# Patient Record
Sex: Male | Born: 1970
Health system: Southern US, Community
[De-identification: ages and names within clinical notes are randomized; demographics above are authoritative.]

## PROBLEM LIST (undated history)

## (undated) DIAGNOSIS — E669 Obesity, unspecified: Secondary | ICD-10-CM

## (undated) DIAGNOSIS — M1990 Unspecified osteoarthritis, unspecified site: Secondary | ICD-10-CM

## (undated) DIAGNOSIS — M545 Low back pain, unspecified: Secondary | ICD-10-CM

## (undated) DIAGNOSIS — J40 Bronchitis, not specified as acute or chronic: Secondary | ICD-10-CM

## (undated) DIAGNOSIS — K219 Gastro-esophageal reflux disease without esophagitis: Secondary | ICD-10-CM

## (undated) DIAGNOSIS — F319 Bipolar disorder, unspecified: Secondary | ICD-10-CM

## (undated) DIAGNOSIS — E785 Hyperlipidemia, unspecified: Secondary | ICD-10-CM

## (undated) HISTORY — DX: Low back pain, unspecified: M54.50

## (undated) HISTORY — DX: Obesity, unspecified: E66.9

## (undated) HISTORY — DX: Unspecified osteoarthritis, unspecified site: M19.90

## (undated) HISTORY — DX: Low back pain: M54.5

## (undated) HISTORY — DX: Hyperlipidemia, unspecified: E78.5

## (undated) HISTORY — DX: Bronchitis, not specified as acute or chronic: J40

## (undated) HISTORY — DX: Gastro-esophageal reflux disease without esophagitis: K21.9

---

## 1999-02-08 ENCOUNTER — Ambulatory Visit (HOSPITAL_COMMUNITY): Admission: RE | Admit: 1999-02-08 | Discharge: 1999-02-08 | Payer: Self-pay | Admitting: *Deleted

## 1999-07-09 ENCOUNTER — Ambulatory Visit (HOSPITAL_COMMUNITY): Admission: RE | Admit: 1999-07-09 | Discharge: 1999-07-09 | Payer: Self-pay | Admitting: *Deleted

## 1999-11-15 ENCOUNTER — Inpatient Hospital Stay (HOSPITAL_COMMUNITY): Admission: AD | Admit: 1999-11-15 | Discharge: 1999-11-21 | Payer: Self-pay | Admitting: Psychiatry

## 1999-11-23 ENCOUNTER — Emergency Department (HOSPITAL_COMMUNITY): Admission: EM | Admit: 1999-11-23 | Discharge: 1999-11-23 | Payer: Self-pay | Admitting: Emergency Medicine

## 2002-08-02 ENCOUNTER — Emergency Department (HOSPITAL_COMMUNITY): Admission: EM | Admit: 2002-08-02 | Discharge: 2002-08-02 | Payer: Self-pay | Admitting: Emergency Medicine

## 2006-10-21 ENCOUNTER — Ambulatory Visit (HOSPITAL_COMMUNITY): Admission: RE | Admit: 2006-10-21 | Discharge: 2006-10-21 | Payer: Self-pay | Admitting: Family Medicine

## 2010-09-22 NOTE — H&P (Signed)
[facility]  Patient:    RAMOND, DARNELL                      MRN: [id number] Adm. Date:  [id number] Attending:  Marlyn Corporal Fabmy Dictator:   Johnella Moloney, N.P.                         History and Physical  IDENTIFYING INFORMATION:  Mr. Fitzgibbon is a 40 year old white single male admitted on an involuntary basis, July 11 for psychotic symptoms including thinking that he was the Messiah and his father actually initiated the involuntary commitment.  Apparently, according to father, he has had violent outbursts over the past few weeks, becoming delusional, angry, drawing pictures of [employer] and [employer] and up all night working on a plan to save the people from [employer] conspiracy occurring on [employer].  HISTORY OF PRESENT ILLNESS:  Patient states, "I beat the stock market.  I took a test for 10 hours."  He stated, "They threw the disk away."  Patient has a long history of mental illness.  He is crying intermittently.  He continues to be delusional.  He admits to drawing pictures of [address] and [place].  He states he has been sleeping 5-6 hours a night.  Appetite according to him has been good; however, at times he says it has not been good.  He is hyperactive, hyperverbal.  He states there is a government conspiracy on [employer] and it is called "1.0."  He is continually asking for his _________ .  He says he cannot focus or think, admits that he is totally depressed, but denies suicidal ideation and he says, "If I were dead, I would go to [facility] and be better off than here on earth."  PAST PSYCHIATRIC HISTORY:  Patient has seen Dr. Jodi Marble for the past 10 years at [facility].  He has no inpatient psychiatric hospitalizations.  PAST MEDICAL HISTORY:  Patient has no primary care doctor.  He denies medical problems.  MEDICATIONS: 1. _________ 20 mg.  He states he took 2-1/2 tablets every morning and then    he says he took one  in the morning, one in the afternoon, and 1-1/2 two    hours before bedtime.  I am not sure how he has been taking the _________. 2. He states he is on Elavil 50 mg at bedtime.  He has been on this since    1994; however, he states he has not taken this in two weeks.  DRUG ALLERGIES:  No known drug allergies.  SOCIAL HISTORY:  Patient apparently lives with his parents.  He is single.  He has one brother and one sister, completed the 12th grade.  He went to [place] for a year and a half.  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  Then he says he has been back 4-5 months from [place].  FAMILY HISTORY:  None.  ALCOHOL/DRUG HISTORY:  Patient smokes marijuana.  He states he takes two drags every other night and has been doing this for seven years.  He last used two days ago.  He denies alcohol abuse.  POSITIVE PHYSICAL EXAMINATION FINDINGS:  His physical exam is pending.  He is too agitated to do a physical exam.  VITAL SIGNS:  Temperature 97.3, pulse 99, respirations 20, blood pressure 124/83, weight 138 pounds, height is 5 feet  9 inches.  CURRENT MENTAL STATUS EXAMINATION:  A somewhat disheveled thin white male who is pacing, getting up and down out of bed.  At one point his roommate walked in.  He took off his shorts and said, "These are yours, arent they?"  Then he put his shorts back on.  He is trying to be cooperative, but due to his agitation, he is having a difficult time.  Speech is pressured, not always relevant.  Mood:  Quiet at times, agitated.  Mood is labile.  He denies current suicidal ideation or homicidal ideation.  Thought process.  Patient is delusional.  He denies hallucinations.  He does have some loose associations. Cognitive:  He is alert and oriented.  He is exhibiting poor judgment, poor impulse control, and poor insight at this point.  CURRENT DIAGNOSES: Axis I:    1. Bipolar disorder, manic.            2.  Attention deficit disorder. Axis II:   Deferred. Axis III:  None. Axis IV:   Severe, related to problems with primary support group in dealing            with his mental illness. Axis V:    Current global assessment of functioning 30, highest in the past            year was 70.  CURRENT TREATMENT PLAN AND RECOMMENDATIONS:  Involuntary commitment to [facility] unit.  Our goal will be to maintain his safety, check every 15 minutes, and also to treat his manic behavior.  We will start off with Eskalith CR 450 mg q.12h. p.o., Depakote ER 500 mg q.h.s. p.o., Cogentin 0.5 mg q.6h. p.r.n. for ETS, Haldol 5 mg p.o. IM q.4h. p.r.n. for agitation, may give two hours later, 1 mg Haldol if the patient is still agitated. Patient may refuse medications if he is not agitated.  Tentative length of stay and discharge plan 3-5 days. DD:  11/16/99 TD:  11/16/99 Job: [id number]

## 2010-09-22 NOTE — Discharge Summary (Signed)
[facility]  Patient:    Eduardo Kelly, Eduardo Kelly                      MRN: [id number] Adm. Date:  [id number] Disc. Date: [id number] Attending:  Cathren Laine                           Discharge Summary  ADMISSION DIAGNOSES: Axis I:    1. Bipolar disorder, manic.            2. Attention-deficit disorder. Axis II:   Deferred. Axis III:  No diagnosis. Axis IV:   Severe (related to problems with primary support group). Axis V:    Global assessment of functioning 30 on admission; highest in past            year being 70.  DISCHARGE DIAGNOSES: Axis I:    1. Bipolar disorder, manic.            2. Attention-deficit disorder. Axis II:   Deferred. Axis III:  No diagnosis. Axis IV:   Severe (related to primary support group). Axis V:    Global assessment of functioning 30 on admission; 60-70 on            discharge.  BRIEF HISTORY:  The patient is a 40 year old, single, Caucasian male whose commitment was initiated by his family.  Patient had become manic and was thinking that he was the messiah.  He was talking about the stock market in a disjointed way.  Patient has previously been followed at the office with diagnosis of attention-deficit disorder with suspicion that he has had manic breakthroughs.  He had been stabilized on Adderall with benefit.  Patient, himself, seemed to have little insight and his manic delusions were very vivid.  PERTINENT PHYSICAL AND LABORATORY DATA:  Physical examination, on admission, was unremarkable.  Admission lab work included a normal CBC, normal routine chemistry profile and normal values on his thyroid profile.  Urine drug screen was positive for amphetamines and marijuana, given that the patient had prescriptions for Adderall via my office.  Urinalysis was essentially unremarkable.  Nitrite and esterase are negative.  COURSE IN HOSPITAL:  The patient was initially given Haldol 5 mg IM q.4h. p.r.n. and Ativan 1 mg p.o. or IM  for agitation.  It was established Eskalith CR 450 mg q.12h. and Depakote ER 500 mg q.h.s. and allowed Cogentin 0.5 mg q.6h. p.r.n. for EPS.  Depakote levels and lithium levels were ordered but are not on the chart at the time of this dictation.  Received Motrin 400 mg q.4h. p.r.n. for pain.  Although he had not had a clear urinary tract infection, given that his urine was turbid, I felt it prudent to put him on Septra DS for 10 days.  Initially confused, coming out of his room naked and walking down the hall. As his hospitalization progressed and his medication kicked in, the patient calmed down considerably.  By the time of his discharge, patient was sleeping through the night, was showing improved ___________ connectiveness.  CONDITION ON DISCHARGE:  Patient is doing well and he has stabilized.  He is calmer and in better touch with reality.  He was tolerating his medication well and without evidence of sedation.  His lithium level was within the therapeutic range.  Depakote level was subtherapeutic but, given his fair clinical response, he was maintained on his dosage.  Patient categorically denies suicidal or homicidal ideas  and is not psychotic.  MEDICATION AND FOLLOW-UP:  Patient was discharged with prescriptions for Septra DS b.i.d. for nine days, Depakote ER 500 mg q.h.s. and Eskalith CR 450 mg b.i.d.  He was to follow up with office in four weeks. DD:  01/05/00 TD:  01/08/00 Job: [id number]

## 2010-09-22 NOTE — H&P (Signed)
[facility]  Patient:    Eduardo Kelly, Eduardo Kelly                      MRN: [id number] Adm. Date:  [id number] Attending:  Marlyn Corporal Fabmy Dictator:   Eduard Roux, NP                         History and Physical  ALLERGIES:  No known allergies.  REVIEW OF SYSTEMS:   HEAD:  Patient reports having a severe headache approximately six weeks ago where "a freight train ran through his head" and the muscles contracted in his face.  He denies history of head trauma and loss of consciousness.  EYES:  Denies visual changes.  Patient does not wear corrective lenses.  EARS:  Denies hearing loss, infections, earaches, or tinnitus; however, patient reports putting a pencil in both ears.  NOSE:  No upper respiratory tract symptoms.  No epistaxis.  THROAT:  Denies hoarseness, pain or lesions.  DENTITION:  Denies any gum infections or toothache.  Patient does bruxate.  SKIN:  Denies any abnormal bruises, rashes or swelling. CARDIOVASCULAR:  Denies chest pain or previous MI.  Reports some intermittent palpitations.  No history of a murmur or hypertension.  PULMONARY:  Patient is a smoker; he smokes approximately a pack a day x 7 years.  There is no cough, dyspnea or asthma.  GASTROINTESTINAL:  Denies abdominal pain, peptic ulcer disease, melena.  No nausea, vomiting or diarrhea.  GENITOURINARY:  Denies dysuria, urgency, frequency or any discharge.  MUSCULOSKELETAL:  Reports chronic low back strain.  States he has fractured his left ulna.  NERVOUS SYSTEM:  Questions whether he had a seizure the night that he had that severe headache, with no documented history of epilepsy.  HEMATOPOIETIC:  Denies anemia and has never received a blood transfusion.  ENDOCRINE:  No known history of thyroid ______ or diabetes.  PHYSICAL EXAMINATION:  VITAL SIGNS:  Temperature 98.1.  Pulse 81.  Respirations are 18.  He is 5 feet 9 inches.  He weighs 138 pounds.  His blood pressure was  100/64.  GENERAL APPEARANCE:  He is a thin white male.  He is very alert and cooperative and very pleasant.  SKIN:  There is a birth mark to his sacrum.  There are bites to bilateral ankles and it appears as though they are mosquito bites and some acne to his back; otherwise, normal dermatological exam.  HEENT:  Head was normocephalic.  Eyes:  Extraocular movements intact.  Pupils are equal, round and reactive to light.  Funduscopic exam was benign.  Ears: Right external ear canal was erythematous.  Tympanic membranes exhibited normal landmarks.  Nose:  There was no septal deviation.  Nares were very small.  Throat:  Buccal mucosa was clear.  There were no lesions.  Tongue was in the midline without vesiculations.  Dentition is in good repair.  NECK:  Supple with trachea midline.  THORAX:  Bilateral symmetrical expansion.  LUNGS:  Clear to auscultation.  HEART:  S1 and S2.  No clicks, rubs or murmurs could be appreciated.  VASCULAR:  Pulses were equal.  No bruits were auscultated, carotid or aortic. No dependent edema.  ABDOMEN:  Soft.  No masses with palpation.  Hyperactive bowel sounds x 4.  No tenderness.  No organomegaly could be appreciated.  GLANDULAR:  There was no adenopathy, supraclavicular or cervical.  Palpable thyroid without nodularity.  BONES AND JOINTS:  No swelling, tenderness or deformity noted.  MUSCULOSKELETAL:  There is no atrophy.  Strength was equal bilaterally, 5/5. Negative straight leg raise.  EXTREMITIES:  Full range of motion.  There was no cyanosis or clubbing. Slight bilateral knee crepitus.  NEUROLOGIC:  Cranial nerves II-XII were grossly intact.  Motor function is intact, 5/5.  Cerebellar function is intact.  Patient could perform finger-to-nose, heel-to-shin and rapid alternating movements.  Romberg was negative.  Deep tendon reflexes were 2.  IMPRESSION:  Normal physical exam.  No acute findings. DD:  11/17/99 TD:  11/17/99 Job:  [id number]

## 2010-11-14 ENCOUNTER — Encounter: Payer: Self-pay | Admitting: Family Medicine

## 2010-11-14 DIAGNOSIS — J40 Bronchitis, not specified as acute or chronic: Secondary | ICD-10-CM | POA: Insufficient documentation

## 2010-11-14 DIAGNOSIS — M545 Low back pain: Secondary | ICD-10-CM | POA: Insufficient documentation

## 2010-11-14 DIAGNOSIS — E669 Obesity, unspecified: Secondary | ICD-10-CM | POA: Insufficient documentation

## 2011-08-23 DIAGNOSIS — S5010XA Contusion of unspecified forearm, initial encounter: Secondary | ICD-10-CM | POA: Diagnosis not present

## 2011-08-23 DIAGNOSIS — IMO0002 Reserved for concepts with insufficient information to code with codable children: Secondary | ICD-10-CM | POA: Diagnosis not present

## 2012-09-02 ENCOUNTER — Ambulatory Visit (INDEPENDENT_AMBULATORY_CARE_PROVIDER_SITE_OTHER): Payer: Medicaid Other | Admitting: Family Medicine

## 2012-09-02 ENCOUNTER — Other Ambulatory Visit: Payer: Self-pay | Admitting: Family Medicine

## 2012-09-02 ENCOUNTER — Encounter: Payer: Self-pay | Admitting: Family Medicine

## 2012-09-02 VITALS — BP 140/98 | HR 100 | Temp 98.9°F | Resp 16 | Wt 238.0 lb

## 2012-09-02 DIAGNOSIS — R109 Unspecified abdominal pain: Secondary | ICD-10-CM | POA: Diagnosis not present

## 2012-09-02 DIAGNOSIS — M5137 Other intervertebral disc degeneration, lumbosacral region: Secondary | ICD-10-CM

## 2012-09-02 DIAGNOSIS — R635 Abnormal weight gain: Secondary | ICD-10-CM

## 2012-09-02 DIAGNOSIS — M1990 Unspecified osteoarthritis, unspecified site: Secondary | ICD-10-CM | POA: Insufficient documentation

## 2012-09-02 DIAGNOSIS — M5136 Other intervertebral disc degeneration, lumbar region: Secondary | ICD-10-CM

## 2012-09-02 DIAGNOSIS — E782 Mixed hyperlipidemia: Secondary | ICD-10-CM | POA: Diagnosis not present

## 2012-09-02 LAB — CBC WITH DIFFERENTIAL/PLATELET
Basophils Absolute: 0 10*3/uL (ref 0.0–0.1)
Basophils Relative: 0 % (ref 0–1)
Eosinophils Absolute: 0.1 10*3/uL (ref 0.0–0.7)
Eosinophils Relative: 1 % (ref 0–5)
HCT: 45.8 % (ref 39.0–52.0)
Hemoglobin: 16.1 g/dL (ref 13.0–17.0)
Lymphocytes Relative: 27 % (ref 12–46)
Lymphs Abs: 3.1 10*3/uL (ref 0.7–4.0)
MCH: 29.5 pg (ref 26.0–34.0)
MCHC: 35.2 g/dL (ref 30.0–36.0)
MCV: 84 fL (ref 78.0–100.0)
Monocytes Absolute: 0.9 10*3/uL (ref 0.1–1.0)
Monocytes Relative: 8 % (ref 3–12)
Neutro Abs: 7.3 10*3/uL (ref 1.7–7.7)
Neutrophils Relative %: 64 % (ref 43–77)
Platelets: 331 10*3/uL (ref 150–400)
RBC: 5.45 MIL/uL (ref 4.22–5.81)
RDW: 13.4 % (ref 11.5–15.5)
WBC: 11.5 10*3/uL — ABNORMAL HIGH (ref 4.0–10.5)

## 2012-09-02 MED ORDER — PANTOPRAZOLE SODIUM 40 MG PO TBEC: 40.0000 mg | DELAYED_RELEASE_TABLET | Freq: Two times a day (BID) | ORAL | Status: DC

## 2012-09-02 NOTE — Progress Notes (Signed)
Subjective:    Patient ID: Eduardo Kelly, male    DOB: 1970-10-31, 42 y.o.   MRN: [id number]  HPI  Patient presents with 7-8 months of epigastric to right upper quadrant abdominal pain. He states the pain is constant. It is a gnawing pressure-like sensation. He has no exacerbating factors. He states that when he needs the pain is better somewhat for a short period of time. He has had one episode of melanotic stool. He denies any hematochezia. He denies any fever. He also reports weight gain and fatigue. Due to the weight gain he has recently resumed smoking. He states his blood pressures are then controlled in the past but it is elevated today. He denies any chest pain or shortness of breath.  He is been using Prilosec for the last 8 days due to severe reflux. He is also been consuming excessive TUMS.  He has a history of degenerative disease in the L-spine per his report. He is currently controlled using a TENS unit for muscle relaxation. He requires no pain killers or NSAIDs for his back. Hewould like a  prescription for a TENS unit.  Past Medical History  Diagnosis Date  . Mild obesity   . Bronchitis   . Low back pain   . Arthritis     ddd l-spine   No current outpatient prescriptions on file prior to visit.   No current facility-administered medications on file prior to visit.   No Known Allergies History   Social History  . Marital Status: Single    Spouse Name: N/A    Number of Children: N/A  . Years of Education: N/A   Occupational History  . Not on file.   Social History Main Topics  . Smoking status: Current Some Day Smoker  . Smokeless tobacco: Not on file  . Alcohol Use: Not on file  . Drug Use: Not on file  . Sexually Active: Not on file   Other Topics Concern  . Not on file   Social History Narrative  . No narrative on file     Review of Systems Remainder of review of systems is negative    Objective:   Physical Exam  Constitutional: He appears  well-developed and well-nourished.  Cardiovascular: Normal rate, regular rhythm, normal heart sounds and intact distal pulses.  Exam reveals no gallop and no friction rub.   No murmur heard. Pulmonary/Chest: Effort normal and breath sounds normal. No respiratory distress. He has no wheezes. He has no rales. He exhibits no tenderness.  Abdominal: Soft. Bowel sounds are normal. He exhibits no distension. There is tenderness (tender to palpation in RUQ). There is no rebound.  Musculoskeletal: He exhibits tenderness (along L-spine paraspinal muscles.).          Assessment & Plan:  1. Abdominal  pain, other specified site I suspect a duodenal ulcer. Protonix 40 mg by mouth twice a day. Recheck the patient in one month. Sooner if worse. Symptoms are not improved in one month I would get a right upper quadrant ultrasound to rule out biliary tract disease and also consult GI for endoscopy.  We discussed the possibility of gallstones, but the patient states there is no pain upon eating. - Basic Metabolic Panel - CBC with Differential - Lipase - H. pylori Screen - pantoprazole (PROTONIX) 40 MG tablet; Take 1 tablet (40 mg total) by mouth 2 (two) times daily.  Dispense: 60 tablet; Refill: 3  2. Weight gain  - TSH - Testosterone  3.  DDD (degenerative disc disease), lumbar I refilled prescription for TENS unit for the patient.

## 2012-09-03 LAB — TESTOSTERONE: Testosterone: 386 ng/dL (ref 300–890)

## 2012-09-03 LAB — BASIC METABOLIC PANEL
BUN: 16 mg/dL (ref 6–23)
CO2: 23 mEq/L (ref 19–32)
Calcium: 9.8 mg/dL (ref 8.4–10.5)
Chloride: 102 mEq/L (ref 96–112)
Creat: 1.17 mg/dL (ref 0.50–1.35)
Glucose, Bld: 95 mg/dL (ref 70–99)
Potassium: 4.1 mEq/L (ref 3.5–5.3)
Sodium: 137 mEq/L (ref 135–145)

## 2012-09-03 LAB — LIPASE: Lipase: 23 U/L (ref 0–75)

## 2012-09-03 LAB — T4, FREE: Free T4: 1.25 ng/dL (ref 0.80–1.80)

## 2012-09-03 LAB — TSH: TSH: 5.407 u[IU]/mL — ABNORMAL HIGH (ref 0.350–4.500)

## 2012-09-04 ENCOUNTER — Telehealth: Payer: Self-pay | Admitting: Family Medicine

## 2012-09-04 LAB — LIPID PANEL
Cholesterol: 218 mg/dL — ABNORMAL HIGH (ref 0–200)
VLDL: 61 mg/dL — ABNORMAL HIGH (ref 0–40)

## 2012-09-04 NOTE — Telephone Encounter (Signed)
done

## 2012-09-11 LAB — HELICOBACTER PYLORI  ANTIBODY, IGM: Helicobacter pylori, IgM: 4.3 U/mL (ref <9.0)

## 2012-09-22 NOTE — Addendum Note (Signed)
Addended by: Durwin Nora B on: 09/22/2012 10:39 AM   Modules accepted: Orders

## 2012-11-27 ENCOUNTER — Encounter: Payer: Self-pay | Admitting: Family Medicine

## 2012-11-27 ENCOUNTER — Ambulatory Visit (INDEPENDENT_AMBULATORY_CARE_PROVIDER_SITE_OTHER): Payer: Medicare Other | Admitting: Family Medicine

## 2012-11-27 VITALS — BP 126/84 | HR 98 | Temp 98.7°F | Resp 18 | Wt 241.0 lb

## 2012-11-27 DIAGNOSIS — E785 Hyperlipidemia, unspecified: Secondary | ICD-10-CM | POA: Diagnosis not present

## 2012-11-27 DIAGNOSIS — K219 Gastro-esophageal reflux disease without esophagitis: Secondary | ICD-10-CM | POA: Insufficient documentation

## 2012-11-27 NOTE — Progress Notes (Signed)
  Subjective:    Patient ID: Eduardo Kelly, male    DOB: 08/17/1970, 42 y.o.   MRN: [id number]  HPI  Patient is here for followup. Please see his last office visit. His abdominal discomfort stopped immediately upon taking protonix 40 mg by mouth daily.  He denies any further abdominal pain, melena, hematochezia, or fevers. He does get indigestion if he stops taking the PPI.  He is here today to discuss his cholesterol. He was found to have elevated triglycerides, low HDL, elevated LDL at his last ov.  He states he eats too hamburgers every day. He does not exercise at all. He spends most of the day sleeping and eating junk food. He does not exercise due to low back pain.  He does complain of some testicular discomfort after masturbation. He states that the testicles are extremely sensitive to touch afterwards. He denies any dysuria, hematuria or penile discharge. He has not had a sexual partner in 15 years and is not concerned about STDs. Past Medical History  Diagnosis Date  . Mild obesity   . Bronchitis   . Low back pain   . Arthritis     ddd l-spine  . GERD (gastroesophageal reflux disease)   . Dyslipidemia (high LDL; low HDL)    Current Outpatient Prescriptions on File Prior to Visit  Medication Sig Dispense Refill  . pantoprazole (PROTONIX) 40 MG tablet Take 1 tablet (40 mg total) by mouth 2 (two) times daily.  60 tablet  3   No current facility-administered medications on file prior to visit.   No Known Allergies History   Social History  . Marital Status: Single    Spouse Name: N/A    Number of Children: N/A  . Years of Education: N/A   Occupational History  . Not on file.   Social History Main Topics  . Smoking status: Current Some Day Smoker  . Smokeless tobacco: Not on file  . Alcohol Use: No  . Drug Use: Not on file  . Sexually Active: Not on file   Other Topics Concern  . Not on file   Social History Narrative  . No narrative on file     Review of  Systems  All other systems reviewed and are negative.       Objective:   Physical Exam  Constitutional: He appears well-developed and well-nourished.  Cardiovascular: Normal rate, regular rhythm, normal heart sounds and intact distal pulses.   No murmur heard. Pulmonary/Chest: Effort normal and breath sounds normal. No respiratory distress. He has no wheezes. He has no rales.  Abdominal: Soft. Bowel sounds are normal. He exhibits no distension. There is no tenderness. There is no rebound. Hernia confirmed negative in the right inguinal area and confirmed negative in the left inguinal area.  Genitourinary: Penis normal. Right testis shows no mass, no swelling and no tenderness. Left testis shows no mass, no swelling and no tenderness. No penile tenderness.  Lymphadenopathy:       Right: No inguinal adenopathy present.          Assessment & Plan:  1. Dyslipidemia Recommended fish oil 2 g by mouth daily. Recommended a diet low in saturated fats and carbohydrates. Recommended that he increase his intake of fresh fruits and vegetables. Recommended he begin exercising with an ultimate goal of 30 minutes of aerobic exercise 5 days a week.  Recheck in 3 months.

## 2012-12-29 ENCOUNTER — Other Ambulatory Visit: Payer: Self-pay | Admitting: Family Medicine

## 2012-12-29 ENCOUNTER — Telehealth: Payer: Self-pay | Admitting: Family Medicine

## 2012-12-29 MED ORDER — DICLOFENAC SODIUM 1 % TD GEL: 2.0000 g | Freq: Four times a day (QID) | TRANSDERMAL | Status: DC

## 2012-12-29 NOTE — Telephone Encounter (Signed)
..  Patient aware per vm 

## 2012-12-29 NOTE — Telephone Encounter (Signed)
I will e-scribe. 

## 2013-02-19 ENCOUNTER — Ambulatory Visit (INDEPENDENT_AMBULATORY_CARE_PROVIDER_SITE_OTHER): Payer: Medicare Other | Admitting: Family Medicine

## 2013-02-19 ENCOUNTER — Encounter: Payer: Self-pay | Admitting: Family Medicine

## 2013-02-19 VITALS — BP 110/80 | HR 82 | Temp 97.8°F | Resp 18 | Wt 201.0 lb

## 2013-02-19 DIAGNOSIS — Z23 Encounter for immunization: Secondary | ICD-10-CM | POA: Diagnosis not present

## 2013-02-19 DIAGNOSIS — E785 Hyperlipidemia, unspecified: Secondary | ICD-10-CM | POA: Diagnosis not present

## 2013-02-19 NOTE — Progress Notes (Signed)
Subjective:    Patient ID: Eduardo Kelly, male    DOB: [birth year], 42 y.o.   MRN: [id number]  HPI  11/27/12 Patient is here for followup. Please see his last office visit. His abdominal discomfort stopped immediately upon taking protonix 40 mg by mouth daily.  He denies any further abdominal pain, melena, hematochezia, or fevers. He does get indigestion if he stops taking the PPI.  He is here today to discuss his cholesterol. He was found to have elevated triglycerides, low HDL, elevated LDL at his last ov.  He states he eats too hamburgers every day. He does not exercise at all. He spends most of the day sleeping and eating junk food. He does not exercise due to low back pain.  He does complain of some testicular discomfort after masturbation. He states that the testicles are extremely sensitive to touch afterwards. He denies any dysuria, hematuria or penile discharge. He has not had a sexual partner in 15 years and is not concerned about STDs.  At that time, my plan was: 1. Dyslipidemia Recommended fish oil 2 g by mouth daily. Recommended a diet low in saturated fats and carbohydrates. Recommended that he increase his intake of fresh fruits and vegetables. Recommended he begin exercising with an ultimate goal of 30 minutes of aerobic exercise 5 days a week.  Recheck in 3 months.  02/19/13 He is here today for recheck.  He has lost 40 pounds since his last office visit he is exercising every day. He is drastically reduced carbohydrate intake. Past Medical History  Diagnosis Date  . Mild obesity   . Bronchitis   . Low back pain   . Arthritis     ddd l-spine  . GERD (gastroesophageal reflux disease)   . Dyslipidemia (high LDL; low HDL)    Current Outpatient Prescriptions on File Prior to Visit  Medication Sig Dispense Refill  . diclofenac sodium (VOLTAREN) 1 % GEL Apply 2 g topically 4 (four) times daily.  100 g  1  . pantoprazole (PROTONIX) 40 MG tablet Take 1 tablet (40 mg total) by  mouth 2 (two) times daily.  60 tablet  3   No current facility-administered medications on file prior to visit.   No Known Allergies History   Social History  . Marital Status: Single    Spouse Name: N/A    Number of Children: N/A  . Years of Education: N/A   Occupational History  . Not on file.   Social History Main Topics  . Smoking status: Current Some Day Smoker  . Smokeless tobacco: Not on file  . Alcohol Use: No  . Drug Use: Not on file  . Sexual Activity: Not on file   Other Topics Concern  . Not on file   Social History Narrative  . No narrative on file     Review of Systems  All other systems reviewed and are negative.       Objective:   Physical Exam  Constitutional: He appears well-developed and well-nourished.  Cardiovascular: Normal rate, regular rhythm, normal heart sounds and intact distal pulses.   No murmur heard. Pulmonary/Chest: Effort normal and breath sounds normal. No respiratory distress. He has no wheezes. He has no rales.  Abdominal: Soft. Bowel sounds are normal. He exhibits no distension. There is no tenderness. There is no rebound. Hernia confirmed negative in the right inguinal area and confirmed negative in the left inguinal area.  Genitourinary: Penis normal. Right testis shows no mass, no swelling  and no tenderness. Left testis shows no mass, no swelling and no tenderness. No penile tenderness.  Lymphadenopathy:       Right: No inguinal adenopathy present.          Assessment & Plan:  1. Dyslipidemia Return fasting for a fasting lipid panel. Goal HDL is greater than 40, goal triglycerides are less than 250, goal LDL is less than 130. - COMPLETE METABOLIC PANEL WITH GFR; Future - Lipid panel; Future  2. Need for prophylactic vaccination and inoculation against influenza - Flu Vaccine QUAD 36+ mos PF IM (Fluarix)  3. Need for pneumococcal vaccine 4. Need for diphtheria-tetanus-pertussis (Tdap) vaccine, adult/adolescent -  Tdap vaccine greater than or equal to 7yo IM

## 2013-03-27 ENCOUNTER — Encounter: Payer: Self-pay | Admitting: Family Medicine

## 2013-03-27 ENCOUNTER — Ambulatory Visit (INDEPENDENT_AMBULATORY_CARE_PROVIDER_SITE_OTHER): Payer: Medicare Other | Admitting: Family Medicine

## 2013-03-27 VITALS — BP 118/78 | HR 78 | Temp 98.4°F | Resp 18 | Wt 187.0 lb

## 2013-03-27 DIAGNOSIS — E785 Hyperlipidemia, unspecified: Secondary | ICD-10-CM | POA: Diagnosis not present

## 2013-03-27 DIAGNOSIS — M542 Cervicalgia: Secondary | ICD-10-CM

## 2013-03-27 NOTE — Progress Notes (Signed)
  Subjective:    Patient ID: Eduardo Kelly, male    DOB: [birth year], 42 y.o.   MRN: [id number]  HPI  patient is due to have his cholesterol checked. He has been taking fish oil 2000 mg twice a day for the last 3 months. He is interested see if this helped.  His main concern today is of pain in the right side of his throat. It is located on the outside within the body of the sternocleidomastoid muscle. It hurts to rotate his neck or turn his head quickly to the right shoulder. It is tender to palpation. I cannot appreciate any lymphadenopathy or masses in the neck. He denies any fevers or chills. He denies any injury to the neck. It is only been there now for 3 days. Past Medical History  Diagnosis Date  . Mild obesity   . Bronchitis   . Low back pain   . Arthritis     ddd l-spine  . GERD (gastroesophageal reflux disease)   . Dyslipidemia (high LDL; low HDL)    Current Outpatient Prescriptions on File Prior to Visit  Medication Sig Dispense Refill  . diclofenac sodium (VOLTAREN) 1 % GEL Apply 2 g topically 4 (four) times daily.  100 g  1  . pantoprazole (PROTONIX) 40 MG tablet Take 1 tablet (40 mg total) by mouth 2 (two) times daily.  60 tablet  3   No current facility-administered medications on file prior to visit.  No Known Allergies History   Social History  . Marital Status: Single    Spouse Name: N/A    Number of Children: N/A  . Years of Education: N/A   Occupational History  . Not on file.   Social History Main Topics  . Smoking status: Current Some Day Smoker  . Smokeless tobacco: Not on file  . Alcohol Use: No  . Drug Use: Not on file  . Sexual Activity: Not on file   Other Topics Concern  . Not on file   Social History Narrative  . No narrative on file   No family history on file.    Review of Systems  All other systems reviewed and are negative.       Objective:   Physical Exam  Vitals reviewed. Constitutional: He appears well-developed and  well-nourished.  HENT:  Right Ear: External ear normal.  Left Ear: External ear normal.  Nose: Nose normal.  Mouth/Throat: Oropharynx is clear and moist. No oropharyngeal exudate.  Eyes: Conjunctivae are normal.  Neck: Normal range of motion. Neck supple. No JVD present. No tracheal deviation present. No thyromegaly present.  Cardiovascular: Normal rate and regular rhythm.   Pulmonary/Chest: Effort normal and breath sounds normal. No stridor.  Lymphadenopathy:    He has no cervical adenopathy.   patient is tender to palpation in the body of the right sternocleidomastoid muscle. It hurts with rotation of the chin to the right        Assessment & Plan:  1. Other and unspecified hyperlipidemia Recheck fasting lipid panel to reassess his HDL, LDL, and triglyceride levels. - COMPLETE METABOLIC PANEL WITH GFR - Lipid panel  2. Neck pain on right side I believe the patient has strained the sternocleidomastoid muscle. I recommended ibuprofen 600 mg 3 times a day and moist heat for the next week. If symptoms do not improve, I would consider imaging of the neck at that time.

## 2013-03-28 LAB — COMPLETE METABOLIC PANEL WITH GFR
ALT: 30 U/L (ref 0–53)
AST: 35 U/L (ref 0–37)
Albumin: 4.6 g/dL (ref 3.5–5.2)
CO2: 28 mEq/L (ref 19–32)
Calcium: 9.5 mg/dL (ref 8.4–10.5)
Chloride: 103 mEq/L (ref 96–112)
Creat: 1.09 mg/dL (ref 0.50–1.35)
GFR, Est African American: >89 mL/min
Glucose, Bld: 74 mg/dL (ref 70–99)
Potassium: 4.7 mEq/L (ref 3.5–5.3)
Total Protein: 6.9 g/dL (ref 6.0–8.3)

## 2013-03-28 LAB — LIPID PANEL
LDL Cholesterol: 99 mg/dL (ref 0–99)
Triglycerides: 116 mg/dL (ref <150)

## 2013-03-31 ENCOUNTER — Encounter: Payer: Self-pay | Admitting: Family Medicine

## 2014-03-12 ENCOUNTER — Ambulatory Visit (INDEPENDENT_AMBULATORY_CARE_PROVIDER_SITE_OTHER): Payer: Medicare Other | Admitting: Family Medicine

## 2014-03-12 ENCOUNTER — Encounter: Payer: Self-pay | Admitting: Family Medicine

## 2014-03-12 VITALS — BP 118/64 | HR 78 | Temp 98.5°F | Resp 16 | Ht 69.0 in | Wt 200.0 lb

## 2014-03-12 DIAGNOSIS — Z23 Encounter for immunization: Secondary | ICD-10-CM

## 2014-03-12 DIAGNOSIS — H6091 Unspecified otitis externa, right ear: Secondary | ICD-10-CM

## 2014-03-12 MED ORDER — CIPROFLOXACIN-DEXAMETHASONE 0.3-0.1 % OT SUSP: 4.0000 [drp] | Freq: Two times a day (BID) | OTIC | Status: DC

## 2014-03-12 MED ORDER — NEOMYCIN-POLYMYXIN-HC 3.5-10000-1 OT SOLN: 3.0000 [drp] | Freq: Four times a day (QID) | OTIC | Status: DC

## 2014-03-12 NOTE — Patient Instructions (Signed)
Apply drops as prescribed Flu shot given F/u as needed

## 2014-03-12 NOTE — Progress Notes (Signed)
Patient ID: Eduardo ConverseWilliam G Markert, male   DOB: [birth year], 43 y.o.   MRN: [id number]   Subjective:    Patient ID: Eduardo Kelly, male    DOB: [birth year], 43 y.o.   MRN: [id number]  Patient presents for R Ear Pain   Patient here with right ear pain worsening over the past 4 weeks. He's also had some drainage from his ear. He has tried pouring peroxide and then using Q-tips which she states he was getting out wax and another liquidy substance. He has not had any fever sinus congestion. He also requests a flu shot    Review Of Systems: - per above   GEN- denies fatigue, fever, weight loss,weakness, recent illness HEENT- denies eye drainage, change in vision, nasal discharge, CVS- denies chest pain, palpitations RESP- denies SOB, cough, wheeze Neuro- denies headache, dizziness, syncope, seizure activity       Objective:    BP 118/64 mmHg  Pulse 78  Temp(Src) 98.5 F (36.9 C) (Oral)  Resp 16  Ht 5\' 9"  (1.753 m)  Wt 200 lb (90.719 kg)  BMI 29.52 kg/m2 GEN- NAD, alert and oriented x3 HEENT- PERRL, EOMI, non injected sclera, pink conjunctiva, MMM, Right TM edematous, mild bleeding, yellow pus in canal, unable to completely visualize TM, left canal clear, TM clear no effusion  oropharynx clear Neck- Supple, No LAD       Assessment & Plan:      Problem List Items Addressed This Visit    None    Visit Diagnoses    OE (otitis externa), right    -  Primary    Treat with otic drops, ciprodex not covered changed to coritisporin, no improvement send to ENT    Relevant Medications       ciprofloxacin-dexamethasone (CIPRODEX) otic suspension       neomycin-polymyxin-hydrocortisone (CORTISPORIN) otic solution    Other Relevant Orders       Flu Vaccine QUAD 36+ mos PF IM (Fluarix Quad PF) (Completed)    Need for prophylactic vaccination and inoculation against influenza        Relevant Medications       ciprofloxacin-dexamethasone (CIPRODEX) otic suspension    neomycin-polymyxin-hydrocortisone (CORTISPORIN) otic solution    Other Relevant Orders       Flu Vaccine QUAD 36+ mos PF IM (Fluarix Quad PF) (Completed)       Note: This dictation was prepared with Dragon dictation along with smaller phrase technology. Any transcriptional errors that result from this process are unintentional.

## 2014-03-15 ENCOUNTER — Ambulatory Visit: Payer: Medicare Other | Admitting: Family Medicine

## 2014-08-12 ENCOUNTER — Ambulatory Visit (INDEPENDENT_AMBULATORY_CARE_PROVIDER_SITE_OTHER): Payer: Medicare Other | Admitting: Physician Assistant

## 2014-08-12 ENCOUNTER — Encounter: Payer: Self-pay | Admitting: Physician Assistant

## 2014-08-12 VITALS — BP 104/78 | HR 84 | Temp 98.6°F | Resp 18 | Wt 207.0 lb

## 2014-08-12 DIAGNOSIS — J029 Acute pharyngitis, unspecified: Secondary | ICD-10-CM

## 2014-08-12 MED ORDER — AMOXICILLIN-POT CLAVULANATE 875-125 MG PO TABS: 1.0000 | ORAL_TABLET | Freq: Two times a day (BID) | ORAL | Status: DC

## 2014-08-12 NOTE — Progress Notes (Signed)
    Patient ID: Murrell ConverseWilliam G Dalpe MRN: [id number], DOB: 07-06-1970, 44 y.o. Date of Encounter: 08/12/2014, 12:08 PM    Chief Complaint:  Chief Complaint  Patient presents with  . recurrent neck pain x 2 wks    much worse last 2 days, rt sided     HPI: 44 y.o. year old white male states that for the past 2 weeks he has had sore throat when he swallows. Says now the right side of his anterior neck is feeling sore to the touch. When he tilts his head to the side he can feel tenderness in that right anterior neck. Has had no mucus from his nose or other drainage from his nose. No cough and no chest congestion. No fever or chills.     Home Meds:   Outpatient Prescriptions Prior to Visit  Medication Sig Dispense Refill  . neomycin-polymyxin-hydrocortisone (CORTISPORIN) otic solution Place 3 drops into the right ear 4 (four) times daily. 10 mL 0   No facility-administered medications prior to visit.    Allergies: No Known Allergies    Review of Systems: See HPI for pertinent ROS. All other ROS negative.    Physical Exam: Blood pressure 104/78, pulse 84, temperature 98.6 F (37 C), temperature source Oral, resp. rate 18, weight 207 lb (93.895 kg)., Body mass index is 30.55 kg/(m^2). General:  WNWD WM.Appears in no acute distress. HEENT: Normocephalic, atraumatic, eyes without discharge, sclera non-icteric, nares are without discharge. Bilateral auditory canals clear, TM's are without perforation, pearly grey and translucent with reflective cone of light bilaterally. Oral cavity moist. There is moderate erythema of the posterior pharynx. No exudate or peritonsillar abscess.  Neck: Supple. No thyromegaly. He reports tenderness with palpation of right anterior cervical lymph nodes. However I am not palpating any enlarged nodes there. No other masses or abnormalities at that areas of his tenderness. Lungs: Clear bilaterally to auscultation without wheezes, rales, or rhonchi. Breathing is  unlabored. Heart: Regular rhythm. No murmurs, rubs, or gallops. Msk:  Strength and tone normal for age. Extremities/Skin: Warm and dry.  Neuro: Alert and oriented X 3. Moves all extremities spontaneously. Gait is normal. CNII-XII grossly in tact. Psych:  Responds to questions appropriately with a normal affect.     ASSESSMENT AND PLAN:  44 y.o. year old male with  1. Acute pharyngitis, unspecified pharyngitis type - amoxicillin-clavulanate (AUGMENTIN) 875-125 MG per tablet; Take 1 tablet by mouth 2 (two) times daily.  Dispense: 20 tablet; Refill: 0 He is to take Augmentin as directed. If symptoms worsen significantly in the interim, then go ahead and follow-up. If symptoms do not resolve at completion of antibiotics, then follow-up.  Murray HodgkinsSigned, Mary Beth Lake Lorraine[place], [place], [facility] 08/12/2014 12:08 PM

## 2016-01-24 DIAGNOSIS — M542 Cervicalgia: Secondary | ICD-10-CM | POA: Diagnosis not present

## 2016-01-24 DIAGNOSIS — M5440 Lumbago with sciatica, unspecified side: Secondary | ICD-10-CM | POA: Diagnosis not present

## 2016-01-26 ENCOUNTER — Other Ambulatory Visit: Payer: Self-pay | Admitting: Neurosurgery

## 2016-01-26 DIAGNOSIS — M542 Cervicalgia: Secondary | ICD-10-CM

## 2016-01-26 DIAGNOSIS — M5440 Lumbago with sciatica, unspecified side: Secondary | ICD-10-CM

## 2016-02-03 ENCOUNTER — Ambulatory Visit
Admission: RE | Admit: 2016-02-03 | Discharge: 2016-02-03 | Disposition: A | Discharge status: Home / Self Care | Payer: Medicaid Other | Source: Ambulatory Visit | Attending: Neurosurgery | Admitting: Neurosurgery

## 2016-02-03 DIAGNOSIS — M542 Cervicalgia: Secondary | ICD-10-CM

## 2016-02-03 DIAGNOSIS — M5440 Lumbago with sciatica, unspecified side: Secondary | ICD-10-CM

## 2016-02-03 DIAGNOSIS — M50221 Other cervical disc displacement at C4-C5 level: Secondary | ICD-10-CM | POA: Diagnosis not present

## 2016-02-03 DIAGNOSIS — M5126 Other intervertebral disc displacement, lumbar region: Secondary | ICD-10-CM | POA: Diagnosis not present

## 2016-02-03 DIAGNOSIS — M50222 Other cervical disc displacement at C5-C6 level: Secondary | ICD-10-CM | POA: Diagnosis not present

## 2016-02-03 DIAGNOSIS — M50223 Other cervical disc displacement at C6-C7 level: Secondary | ICD-10-CM | POA: Diagnosis not present

## 2016-02-21 DIAGNOSIS — Z6832 Body mass index (BMI) 32.0-32.9, adult: Secondary | ICD-10-CM | POA: Diagnosis not present

## 2016-02-21 DIAGNOSIS — M542 Cervicalgia: Secondary | ICD-10-CM | POA: Diagnosis not present

## 2016-02-21 DIAGNOSIS — M5440 Lumbago with sciatica, unspecified side: Secondary | ICD-10-CM | POA: Diagnosis not present

## 2016-05-15 ENCOUNTER — Encounter: Payer: Self-pay | Admitting: Family Medicine

## 2016-05-15 ENCOUNTER — Ambulatory Visit (INDEPENDENT_AMBULATORY_CARE_PROVIDER_SITE_OTHER): Payer: Medicare Other | Admitting: Family Medicine

## 2016-05-15 VITALS — BP 100/82 | HR 78 | Temp 98.7°F | Resp 16 | Ht 69.0 in | Wt 224.0 lb

## 2016-05-15 DIAGNOSIS — M722 Plantar fascial fibromatosis: Secondary | ICD-10-CM | POA: Diagnosis not present

## 2016-05-15 DIAGNOSIS — J329 Chronic sinusitis, unspecified: Secondary | ICD-10-CM | POA: Diagnosis not present

## 2016-05-15 LAB — CBC WITH DIFFERENTIAL/PLATELET
BASOS PCT: 0 %
Basophils Absolute: 0 cells/uL (ref 0–200)
Eosinophils Absolute: 136 cells/uL (ref 15–500)
Eosinophils Relative: 2 %
HEMATOCRIT: 46.7 % (ref 38.5–50.0)
HEMOGLOBIN: 15.9 g/dL (ref 13.0–17.0)
Lymphocytes Relative: 35 %
Lymphs Abs: 2380 cells/uL (ref 850–3900)
MCH: 29.7 pg (ref 27.0–33.0)
MCHC: 34 g/dL (ref 32.0–36.0)
MCV: 87.3 fL (ref 80.0–100.0)
MONO ABS: 476 {cells}/uL (ref 200–950)
MPV: 10.6 fL (ref 7.5–12.5)
Monocytes Relative: 7 %
NEUTROS ABS: 3808 {cells}/uL (ref 1500–7800)
Neutrophils Relative %: 56 %
Platelets: 250 10*3/uL (ref 140–400)
RBC: 5.35 MIL/uL (ref 4.20–5.80)
RDW: 13.6 % (ref 11.0–15.0)
WBC: 6.8 10*3/uL (ref 3.8–10.8)

## 2016-05-15 LAB — COMPLETE METABOLIC PANEL WITH GFR
ALBUMIN: 4.2 g/dL (ref 3.6–5.1)
ALT: 43 U/L (ref 9–46)
AST: 30 U/L (ref 10–40)
Alkaline Phosphatase: 86 U/L (ref 40–115)
BILIRUBIN TOTAL: 0.6 mg/dL (ref 0.2–1.2)
BUN: 15 mg/dL (ref 7–25)
CALCIUM: 9.5 mg/dL (ref 8.6–10.3)
CO2: 24 mmol/L (ref 20–31)
CREATININE: 1.05 mg/dL (ref 0.60–1.35)
Chloride: 104 mmol/L (ref 98–110)
GFR, Est Non African American: 85 mL/min (ref >=60)
Glucose, Bld: 79 mg/dL (ref 70–99)
Potassium: 4.3 mmol/L (ref 3.5–5.3)
Sodium: 141 mmol/L (ref 135–146)
TOTAL PROTEIN: 7.2 g/dL (ref 6.1–8.1)

## 2016-05-15 MED ORDER — AMOXICILLIN-POT CLAVULANATE 875-125 MG PO TABS: 1.0000 | ORAL_TABLET | Freq: Two times a day (BID) | ORAL | 0 refills | Status: DC

## 2016-05-15 NOTE — Progress Notes (Signed)
Subjective:    Patient ID: Eduardo Kelly, male    DOB: 11-Aug-1970, 46 y.o.   MRN: [id number]  HPI  Patient reports 2 weeks of symptoms. Symptoms started with an upper respiratory infection. Subsequently developed pain and pressure in his frontal sinuses that was severe continues to have pain and pressure in that area and postnasal drip. Also is having a terrible cough with wheezing. Has been taking Mucinex, Flonase, and Sudafed without relief. Majority of his symptoms include headache, loss of taste, and pain and pressure in his sinuses. Second concern is pain in both feet. Pain is located on the plantar surface of the calcaneus along the medial border near the insertion of the plantar fascia. The pain is intense in the morning with the first few steps the day. It gradually improves as he walks on it. Pain radiates along the path of the plantar fascia. Today he is tender to palpation at the insertion of the plantar fascia on both calcanei. Past Medical History:  Diagnosis Date  . Arthritis    ddd l-spine  . Bronchitis   . Dyslipidemia (high LDL; low HDL)   . GERD (gastroesophageal reflux disease)   . Low back pain   . Mild obesity    No past surgical history on file. No current outpatient prescriptions on file prior to visit.   No current facility-administered medications on file prior to visit.    No Known Allergies Social History   Social History  . Marital status: Single    Spouse name: N/A  . Number of children: N/A  . Years of education: N/A   Occupational History  . Not on file.   Social History Main Topics  . Smoking status: Former Smoker    Quit date: 12/05/2013  . Smokeless tobacco: Former Neurosurgeon     Comment: quit August 2014  . Alcohol use No  . Drug use: No  . Sexual activity: Not Currently   Other Topics Concern  . Not on file   Social History Narrative  . No narrative on file     Review of Systems  All other systems reviewed and are negative.        Objective:   Physical Exam  Constitutional: He appears well-developed and well-nourished.  HENT:  Head: Normocephalic.  Right Ear: External ear normal.  Left Ear: External ear normal.  Nose: Mucosal edema and rhinorrhea present. Right sinus exhibits frontal sinus tenderness. Left sinus exhibits frontal sinus tenderness.  Mouth/Throat: Oropharynx is clear and moist. No oropharyngeal exudate.  Cardiovascular: Normal rate, regular rhythm and normal heart sounds.   Pulmonary/Chest: Effort normal and breath sounds normal. No respiratory distress. He has no wheezes. He has no rales.  Musculoskeletal:       Right foot: There is tenderness. There is normal range of motion, no swelling and no deformity.       Left foot: There is tenderness. There is normal range of motion and no deformity.       Feet:  Skin: He is not diaphoretic.  Vitals reviewed.         Assessment & Plan:  Rhinosinusitis - Plan: amoxicillin-clavulanate (AUGMENTIN) 875-125 MG tablet, CBC with Differential/Platelet, COMPLETE METABOLIC PANEL WITH GFR  Plantar fasciitis, bilateral  I believe the patient has a sinus infection following a upper respiratory infection that has persisted for more than 2 weeks. Recommended Augmentin 875 mg by mouth twice a day for 10 days. Recommend he continue Sudafed and Mucinex. I also believe  that he has bilateral plantar fasciitis.  Gave home stretching handout for him to stretch and increase the flexibility in the plantar fascia.

## 2016-06-18 DIAGNOSIS — Z23 Encounter for immunization: Secondary | ICD-10-CM | POA: Diagnosis not present

## 2016-09-12 ENCOUNTER — Encounter: Payer: Self-pay | Admitting: Physician Assistant

## 2016-09-12 ENCOUNTER — Ambulatory Visit (INDEPENDENT_AMBULATORY_CARE_PROVIDER_SITE_OTHER): Payer: Medicare Other | Admitting: Physician Assistant

## 2016-09-12 VITALS — BP 102/72 | HR 97 | Temp 97.8°F | Resp 14 | Wt 224.2 lb

## 2016-09-12 DIAGNOSIS — R109 Unspecified abdominal pain: Secondary | ICD-10-CM

## 2016-09-12 LAB — URINALYSIS, ROUTINE W REFLEX MICROSCOPIC
Bilirubin Urine: NEGATIVE
Glucose, UA: NEGATIVE
HGB URINE DIPSTICK: NEGATIVE
Ketones, ur: NEGATIVE
Leukocytes, UA: NEGATIVE
NITRITE: NEGATIVE
PH: 5.5 (ref 5.0–8.0)
Protein, ur: NEGATIVE
SPECIFIC GRAVITY, URINE: 1.025 (ref 1.001–1.035)

## 2016-09-12 LAB — CBC
HCT: 46.2 % (ref 38.5–50.0)
HEMOGLOBIN: 16.1 g/dL (ref 13.0–17.0)
MCH: 29.8 pg (ref 27.0–33.0)
MCHC: 34.8 g/dL (ref 32.0–36.0)
MCV: 85.4 fL (ref 80.0–100.0)
Platelets: 255 10*3/uL (ref 140–400)
RBC: 5.41 MIL/uL (ref 4.20–5.80)
RDW: 12.9 % (ref 11.0–15.0)
WBC: 9.8 10*3/uL (ref 3.8–10.8)

## 2016-09-12 NOTE — Progress Notes (Signed)
Patient ID: Eduardo Kelly MRN: [id number], DOB: 06-Oct-1970, 46 y.o. Date of Encounter: @DATE @  Chief Complaint:  Chief Complaint  Patient presents with  . Abdominal Pain    x1 wk    HPI: 46 y.o. year old male  presents with above.   He points to his right flank on the lateral side of his torso as area where he has been feeling this discomfort. Says that he has been feeling a cramping, stabbing, burning pain there. Says that he started noticing it about a week ago but it has gotten worse the last couple of days. Says that the pain has been constant for the past approximate 48 hours. Says that the pain increases anytime he coughs or sneezes. Says that the pain stays steady at about 7/10 all the time and only gets worse if he coughs sneezes or changes positions in bed-- then it goes to a 10 over 10.  States that he has been having bowel movement daily as usual and has had no change in bowel habits.  States he has no known history of kidney stone and has noted no abnormality to the color of his urine and is having no dysuria.  Has had no nausea or vomiting.  Has had no fever or chills.   Past Medical History:  Diagnosis Date  . Arthritis    ddd l-spine  . Bronchitis   . Dyslipidemia (high LDL; low HDL)   . GERD (gastroesophageal reflux disease)   . Low back pain   . Mild obesity      Home Meds: Outpatient Medications Prior to Visit  Medication Sig Dispense Refill  . amoxicillin-clavulanate (AUGMENTIN) 875-125 MG tablet Take 1 tablet by mouth 2 (two) times daily. 20 tablet 0   No facility-administered medications prior to visit.     Allergies: No Known Allergies  Social History   Social History  . Marital status: Single    Spouse name: N/A  . Number of children: N/A  . Years of education: N/A   Occupational History  . Not on file.   Social History Main Topics  . Smoking status: Former Smoker    Quit date: 12/05/2013  . Smokeless tobacco: Former Neurosurgeon   Comment: quit August 2014  . Alcohol use No  . Drug use: No  . Sexual activity: Not Currently   Other Topics Concern  . Not on file   Social History Narrative  . No narrative on file    No family history on file.   Review of Systems:  See HPI for pertinent ROS. All other ROS negative.    Physical Exam: Blood pressure 102/72, pulse 97, temperature 97.8 F (36.6 C), temperature source Oral, resp. rate 14, weight 224 lb 3.2 oz (101.7 kg), SpO2 98 %., Body mass index is 33.11 kg/m. General: WNWD WM. Appears in no acute distress. Head: Normocephalic, atraumatic, eyes without discharge, sclera non-icteric, nares are without discharge. Bilateral auditory canals clear, TM's are without perforation, pearly grey and translucent with reflective cone of light bilaterally. Oral cavity moist, posterior pharynx without exudate, erythema, peritonsillar abscess, or post nasal drip.  Neck: Supple. No thyromegaly. No lymphadenopathy. Lungs: Clear bilaterally to auscultation without wheezes, rales, or rhonchi. Breathing is unlabored. Heart: RRR with S1 S2. No murmurs, rubs, or gallops. Abdomen: Soft, non-distended with normoactive bowel sounds. No hepatomegaly. No rebound/guarding. No obvious abdominal masses.  There is no tenderness with palpation of peri-umbilical region or right lower quadrant. There is no tenderness with  palpation of right upper quadrant. There is focal area of tenderness at right flank--- directly inferior to axilla----at level of bottom of rib cage there---this is the ONLY area of tenderness with palpation. Musculoskeletal:  Strength and tone normal for age. Extremities/Skin: Warm and dry. No clubbing or cyanosis. No edema. No rashes or suspicious lesions. Neuro: Alert and oriented X 3. Moves all extremities spontaneously. Gait is normal. CNII-XII grossly in tact. Psych:  Responds to questions appropriately with a normal affect.   Results for orders placed or performed in visit  on 09/12/16  Urinalysis, Routine w reflex microscopic  Result Value Ref Range   Color, Urine YELLOW YELLOW   APPearance CLEAR CLEAR   Specific Gravity, Urine 1.025 1.001 - 1.035   pH 5.5 5.0 - 8.0   Glucose, UA NEGATIVE NEGATIVE   Bilirubin Urine NEGATIVE NEGATIVE   Ketones, ur NEGATIVE NEGATIVE   Hgb urine dipstick NEGATIVE NEGATIVE   Protein, ur NEGATIVE NEGATIVE   Nitrite NEGATIVE NEGATIVE   Leukocytes, UA NEGATIVE NEGATIVE  CBC  Result Value Ref Range   WBC 9.8 3.8 - 10.8 K/uL   RBC 5.41 4.20 - 5.80 MIL/uL   Hemoglobin 16.1 13.0 - 17.0 g/dL   HCT 32.446.2 40.138.5 - 02.750.0 %   MCV 85.4 80.0 - 100.0 fL   MCH 29.8 27.0 - 33.0 pg   MCHC 34.8 32.0 - 36.0 g/dL   RDW 25.312.9 66.411.0 - 40.315.0 %   Platelets 255 140 - 400 K/uL     ASSESSMENT AND PLAN:  46 y.o. year old male with  1. Right sided abdominal pain  Physical Exam NOT consistent with appendicitis or cholecystitis.  Also, CBC normal.  UA shows no sign of infection or hematuria to suggest nephrolithiasis.   Send off CMET.  Suspect pain is musculoskeletal. He is to use over-the-counter anti-inflammatory, Tylenol, and he also wants to use topical icy hot. I will follow-up with him when I get further lab result. Discussed indications to go to the ER or follow-up sooner.  - Urinalysis, Routine w reflex microscopic - CBC - COMPLETE METABOLIC PANEL WITH GFR   Signed, [address] Whit[address] StreetMary Beth OldtownDixon, [place], [facility] 09/12/2016 3:01 PM

## 2016-09-13 LAB — COMPLETE METABOLIC PANEL WITH GFR
ALBUMIN: 4.4 g/dL (ref 3.6–5.1)
ALK PHOS: 73 U/L (ref 40–115)
ALT: 34 U/L (ref 9–46)
AST: 27 U/L (ref 10–40)
BILIRUBIN TOTAL: 0.9 mg/dL (ref 0.2–1.2)
BUN: 20 mg/dL (ref 7–25)
CHLORIDE: 104 mmol/L (ref 98–110)
CO2: 29 mmol/L (ref 20–31)
CREATININE: 1.2 mg/dL (ref 0.60–1.35)
Calcium: 9.6 mg/dL (ref 8.6–10.3)
GFR, EST NON AFRICAN AMERICAN: 72 mL/min (ref >=60)
GFR, Est African American: 83 mL/min (ref >=60)
Glucose, Bld: 90 mg/dL (ref 70–99)
Potassium: 4.6 mmol/L (ref 3.5–5.3)
SODIUM: 142 mmol/L (ref 135–146)
TOTAL PROTEIN: 7.1 g/dL (ref 6.1–8.1)

## 2016-09-14 ENCOUNTER — Telehealth: Payer: Self-pay

## 2016-09-14 NOTE — Telephone Encounter (Signed)
Patient called and requested an antibiotic for his throat that is itching and sore. Explained to patient he could take OTC Delsym for his cough and if he found no relief with taking that then he could make an appt to be seen on Monday 5/14. I also went over patient lab results

## 2017-10-22 IMAGING — MR MR LUMBAR SPINE W/O CM
5 series · 48 of 48 positions shown · non-contrast
Comparison: MRI lumbar spine 10/21/2006.

CLINICAL DATA: Chronic, diffuse low back pain radiating into both
legs. No known injury.

EXAM:
MRI LUMBAR SPINE WITHOUT CONTRAST
TECHNIQUE: Multiplanar, multisequence MR imaging of the lumbar spine was
performed. No intravenous contrast was administered.

[Series 3: T2 · sagittal · 4.0mm · 0.94mm/px · 6 of 13 slices shown (1 of 2)]
[im 1/13]
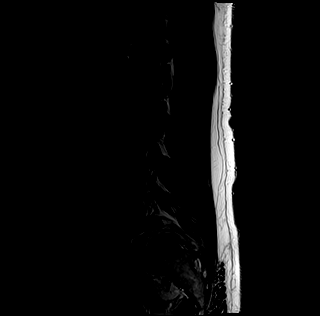
[im 3/13]
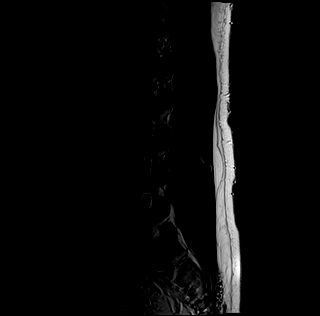
[im 5/13]
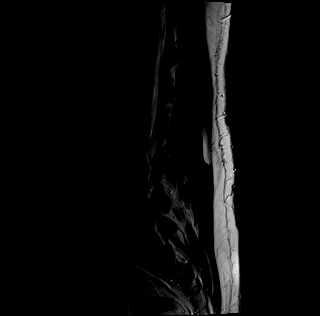
[im 8/13]
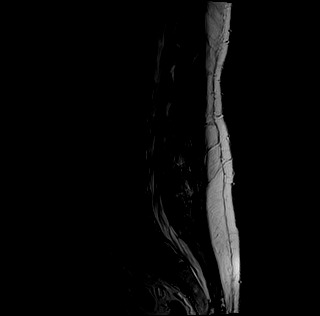
[im 10/13]
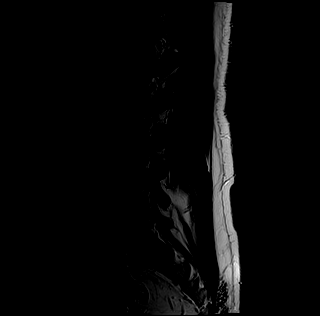
[im 13/13]
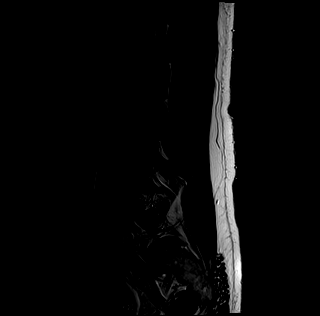

[Series 4: T1 · sagittal · 4.0mm · 0.94mm/px · 5 of 13 slices shown (1 of 2)]
[im 1/13]
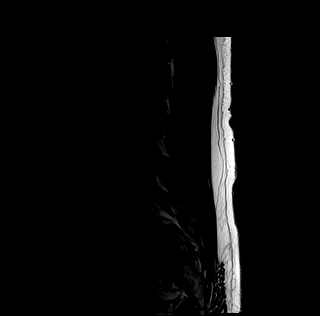
[im 4/13]
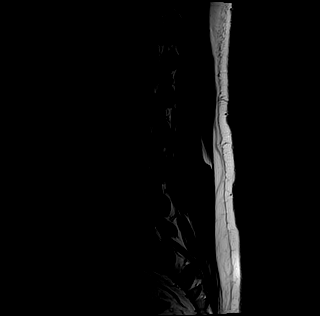
[im 7/13]
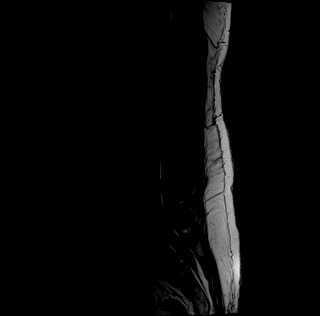
[im 10/13]
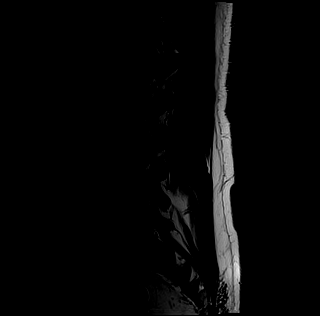
[im 13/13]
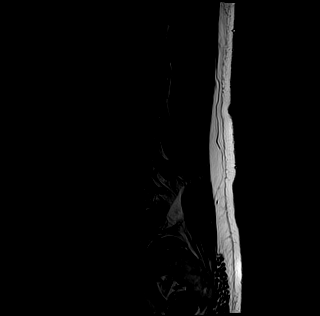

[Series 5: tirm sag · sagittal · 4.0mm · 0.59mm/px · 5 of 13 slices shown]
[im 1/13]
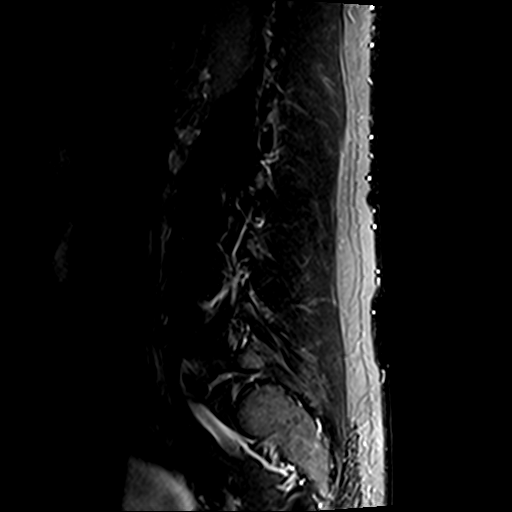
[im 4/13]
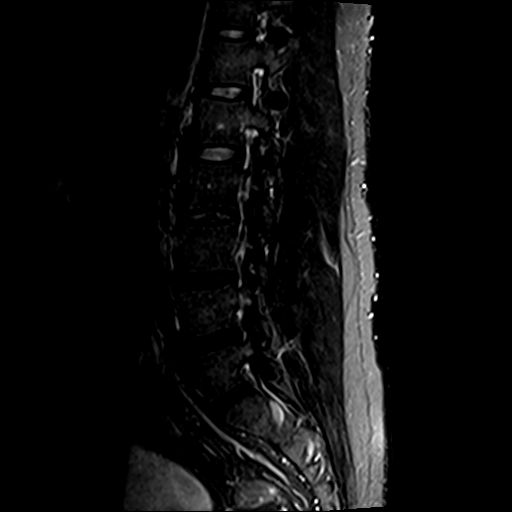
[im 7/13]
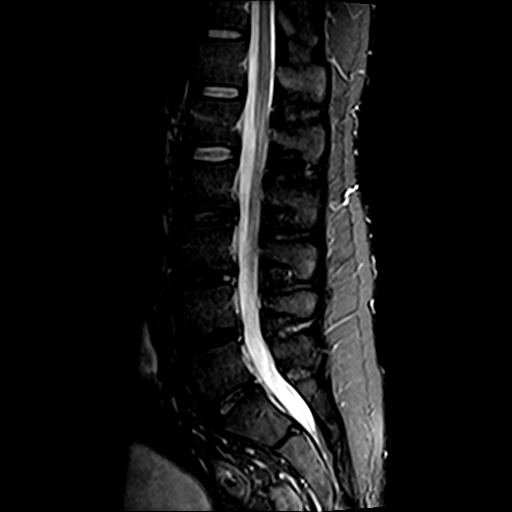
[im 10/13]
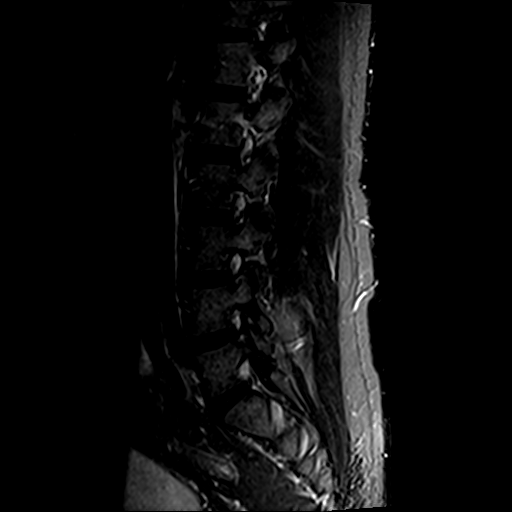
[im 13/13]
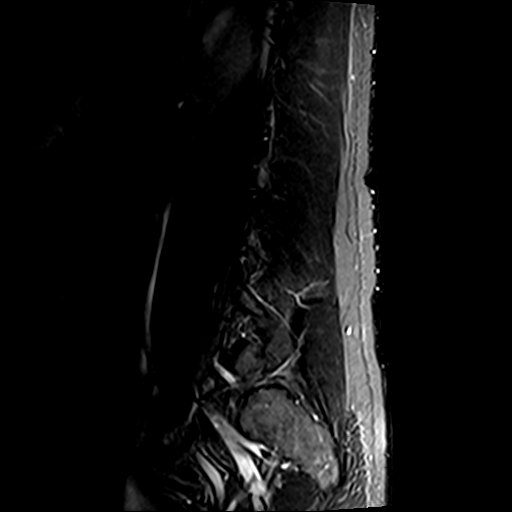

[Series 6: T2 · axial · 4.0mm · 0.74mm/px · z∈[-126,+73]mm · 16 of 38 slices shown (2 of 2)]
[im 1/38]
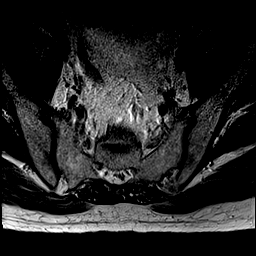
[im 3/38]
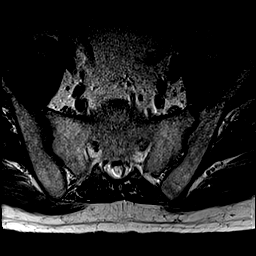
[im 5/38]
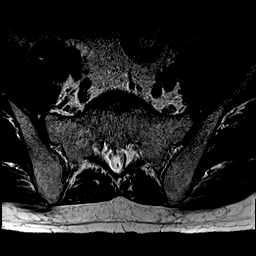
[im 8/38]
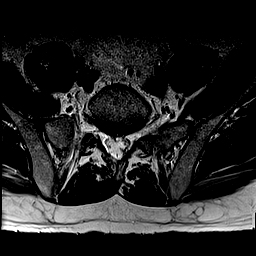
[im 10/38]
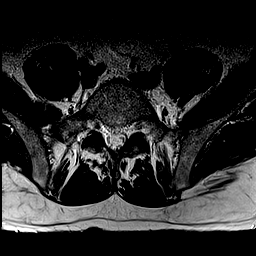
[im 13/38]
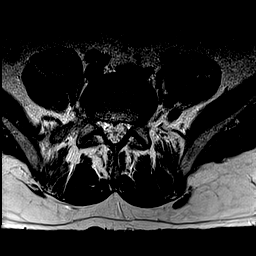
[im 15/38]
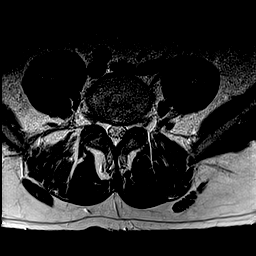
[im 18/38]
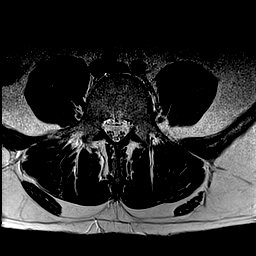
[im 20/38]
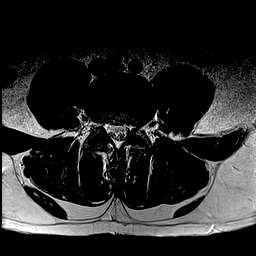
[im 23/38]
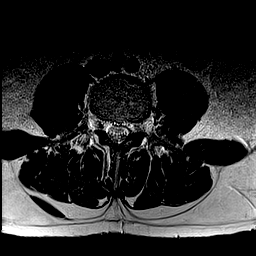
[im 25/38]
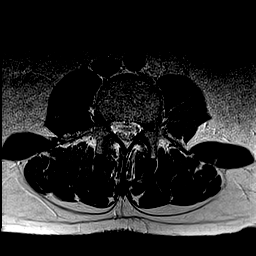
[im 28/38]
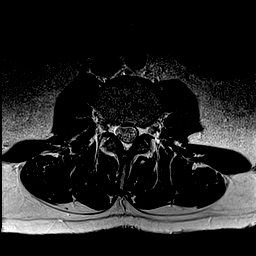
[im 30/38]
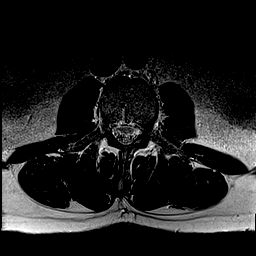
[im 33/38]
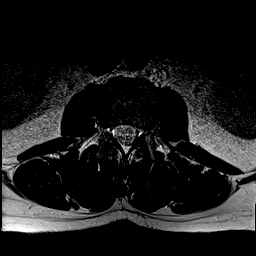
[im 35/38]
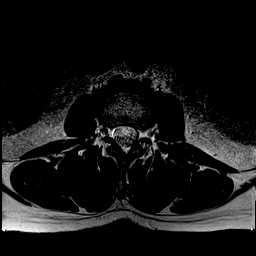
[im 38/38]
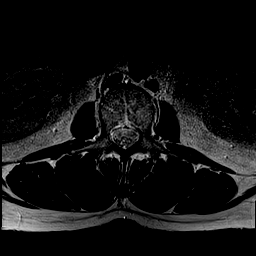

[Series 8: T1 · axial · 4.0mm · 0.74mm/px · z∈[-126,+73]mm · 16 of 38 slices shown (2 of 2)]
[im 1/38]
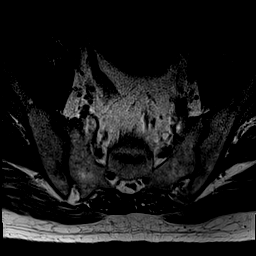
[im 3/38]
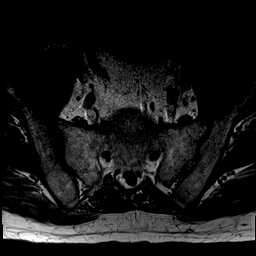
[im 5/38]
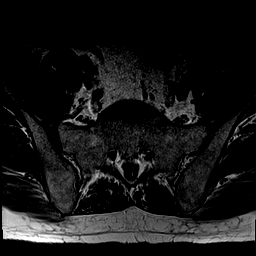
[im 8/38]
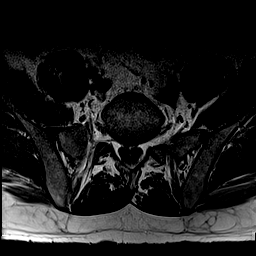
[im 10/38]
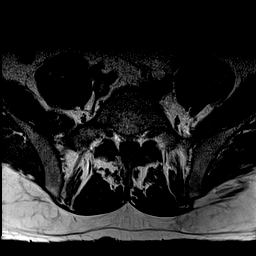
[im 13/38]
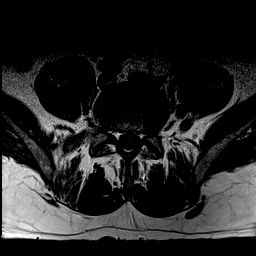
[im 15/38]
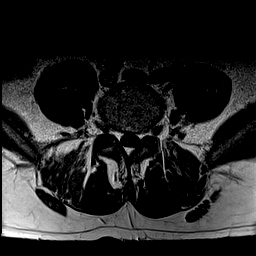
[im 18/38]
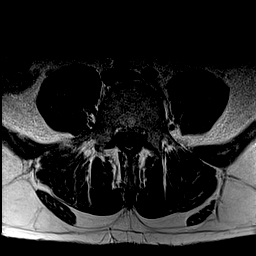
[im 20/38]
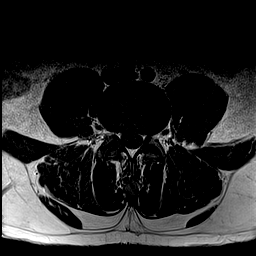
[im 23/38]
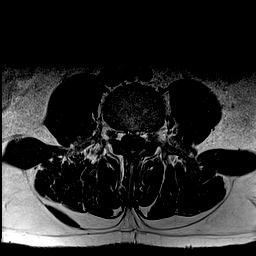
[im 25/38]
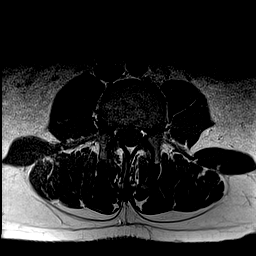
[im 28/38]
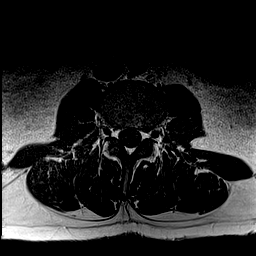
[im 30/38]
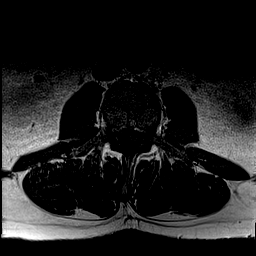
[im 33/38]
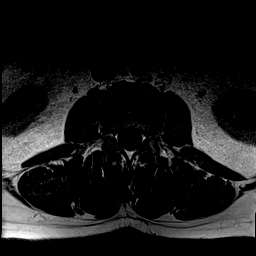
[im 35/38]
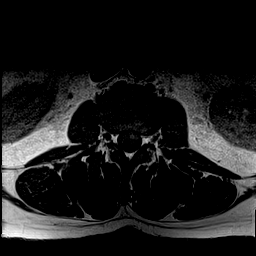
[im 38/38]
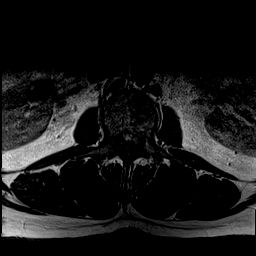

[48 of 48 positions shown; findings below may reference images not displayed]

FINDINGS: Segmentation:  Unremarkable.

Alignment:  Maintained.

Vertebrae:  Height and signal are normal.

Conus medullaris: Extends to the T12-L1 level and appears normal.

Paraspinal and other soft tissues: Unremarkable.

Disc levels:

T11-12 and T12-L1 are imaged in the sagittal plane only and
negative.

L1-2:  Negative.

L2-3: Small annular fissure centrally with a minimal protrusion. The
central spinal canal and neural foramina are widely patent.

L3-4: Annular fissure seen on the prior examination is less
conspicuous. There is a minimal disc bulge but the central canal and
foramina are widely patent.

L4-5: Annular fissure is less conspicuous. Minimal disc bulge
without central canal or foraminal stenosis.

L5-S1: Negative.
IMPRESSION: Mild degenerative disease of the lumbar spine without central canal
or foraminal narrowing. No new abnormality since the comparison
examination.

## 2017-10-22 IMAGING — MR MR CERVICAL SPINE W/O CM
4 of 5 series · 21 of 48 positions shown · non-contrast
Comparison: Report from 07/09/1999

CLINICAL DATA: Diffuse cervical neck pain for years.

EXAM:
MRI CERVICAL SPINE WITHOUT CONTRAST
TECHNIQUE: Multiplanar, multisequence MR imaging of the cervical spine was
performed. No intravenous contrast was administered.

[Series 2: T2 · sagittal · 3.0mm · 0.41mm/px · 6 of 13 slices shown (1 of 3)]
[im 1/13]
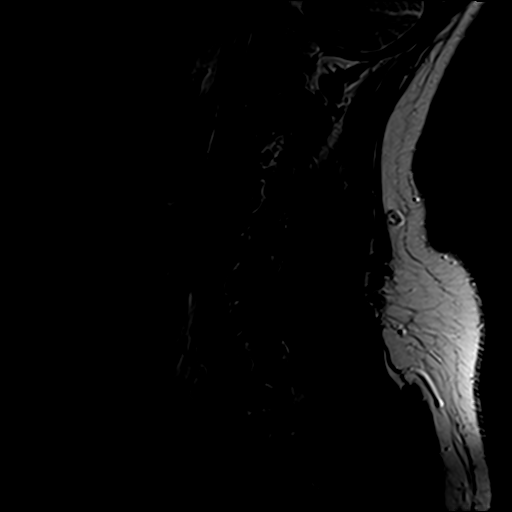
[im 3/13]
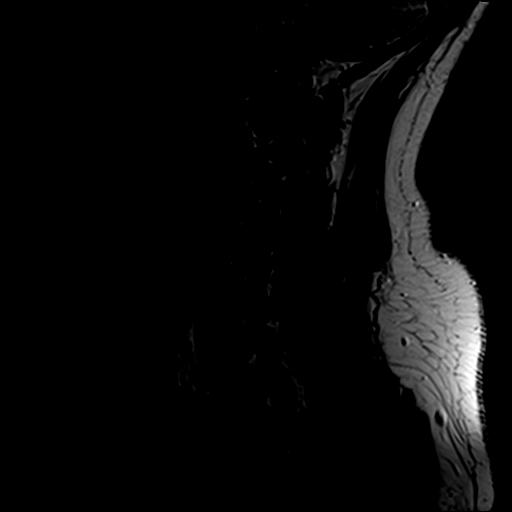
[im 5/13]
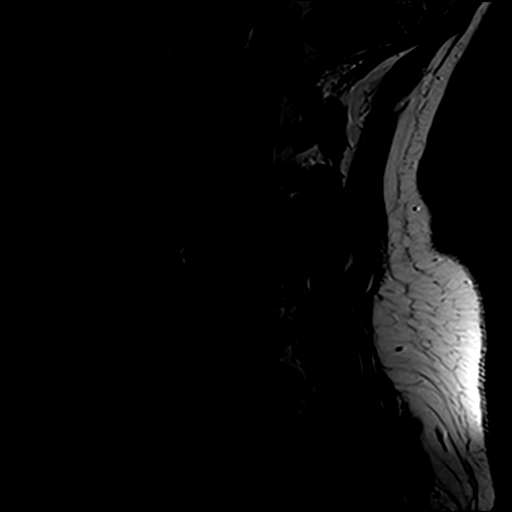
[im 8/13]
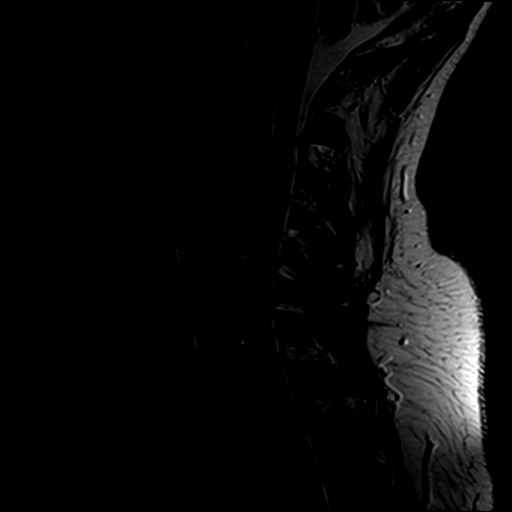
[im 10/13]
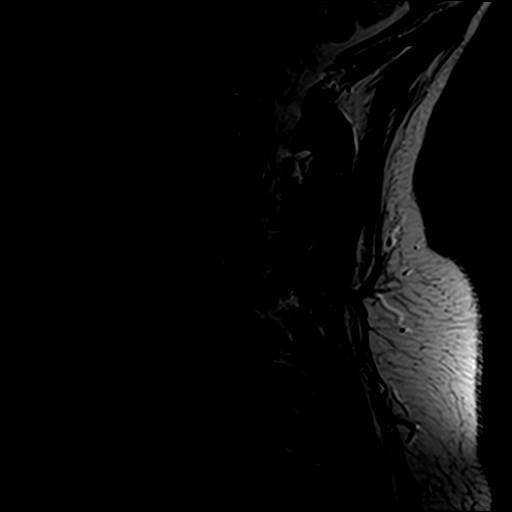
[im 13/13]
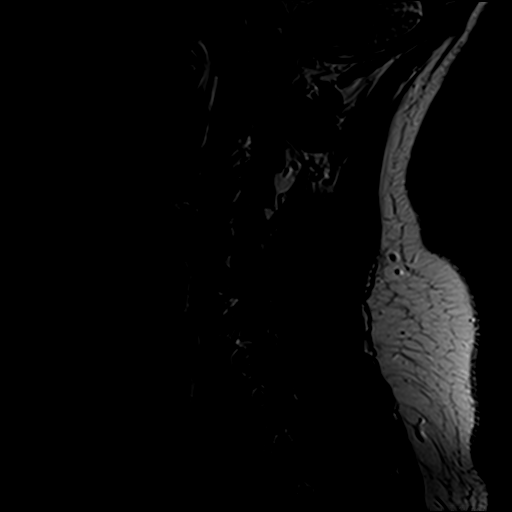

[Series 3: T1 · sagittal · 3.0mm · 0.41mm/px · 3 of 13 slices shown]
[im 3/13]
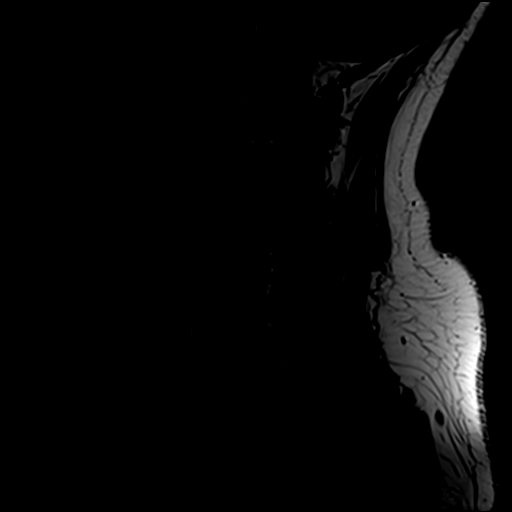
[im 8/13]
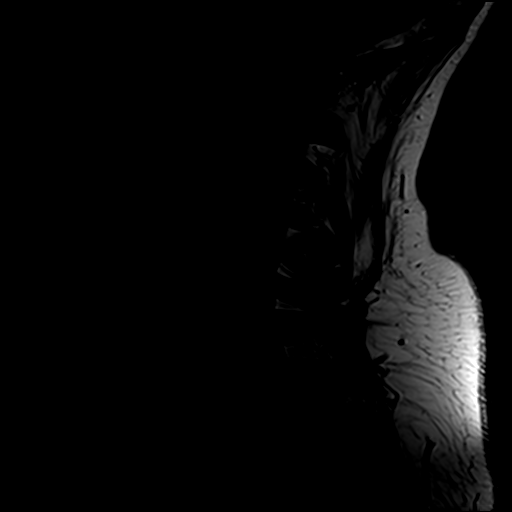
[im 13/13]
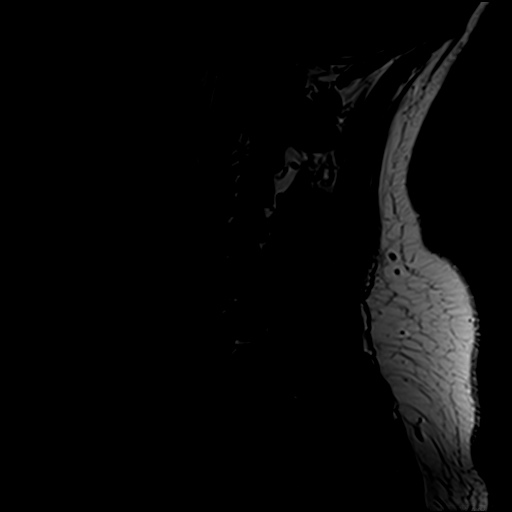

[Series 5: T2 · axial · 3.0mm · 0.39mm/px · z∈[-39,+72]mm · 9 of 30 slices shown (2 of 3)]
[im 1/30]
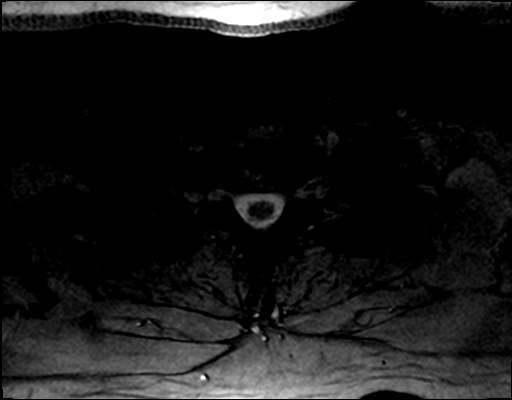
[im 5/30]
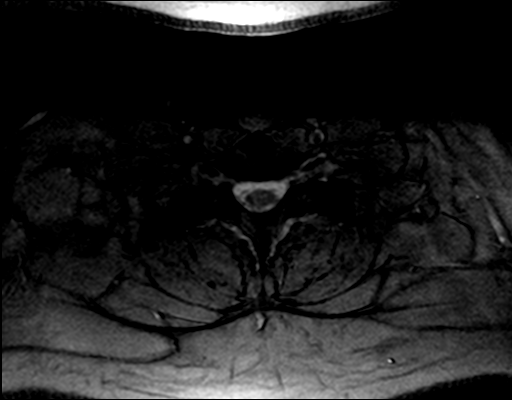
[im 9/30]
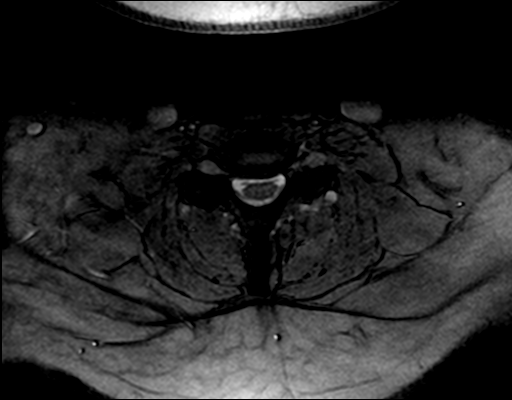
[im 13/30]
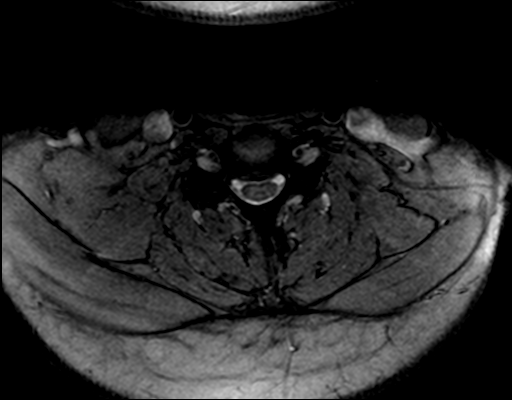
[im 15/30]
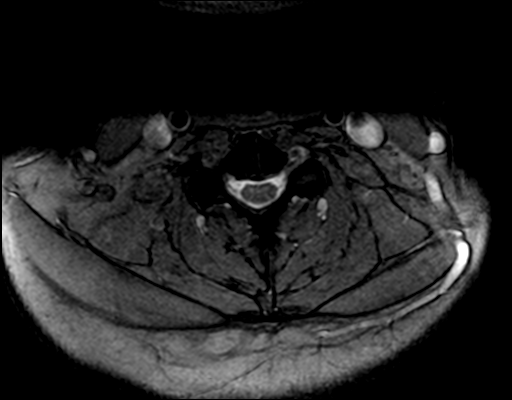
[im 17/30]
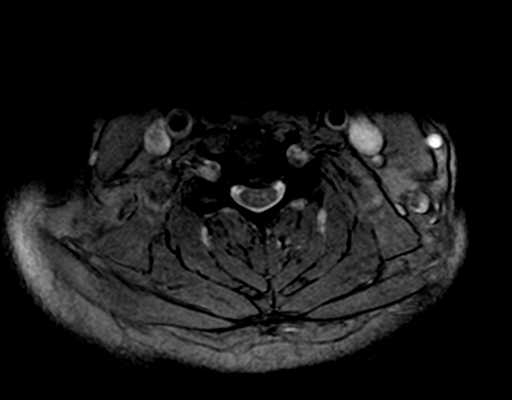
[im 21/30]
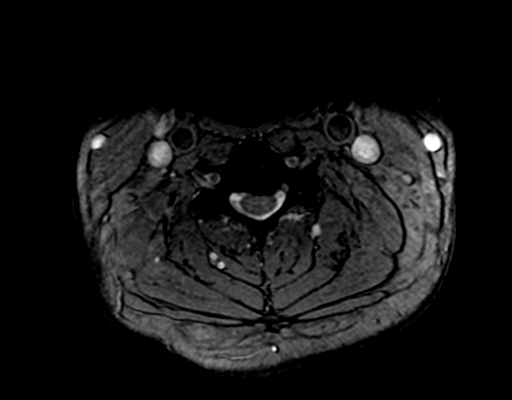
[im 25/30]
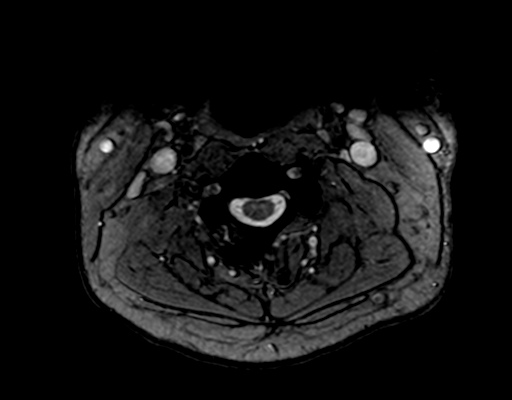
[im 30/30]
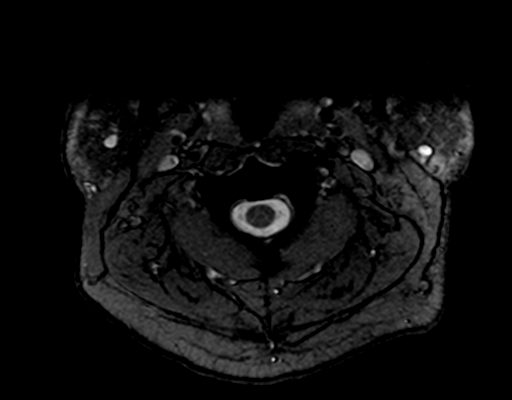

[Series 6: T2 · axial · 3.0mm · 0.39mm/px · z∈[-24,+52]mm · 3 of 30 slices shown (3 of 3)]
[im 5/30]
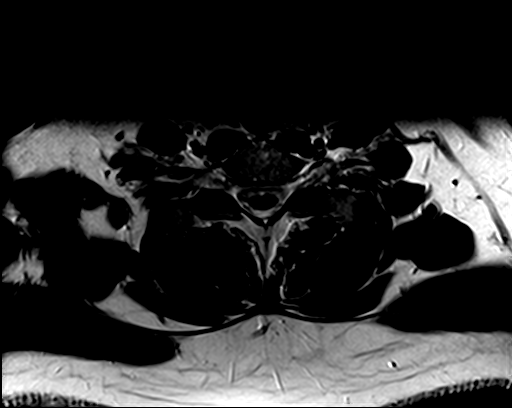
[im 15/30]
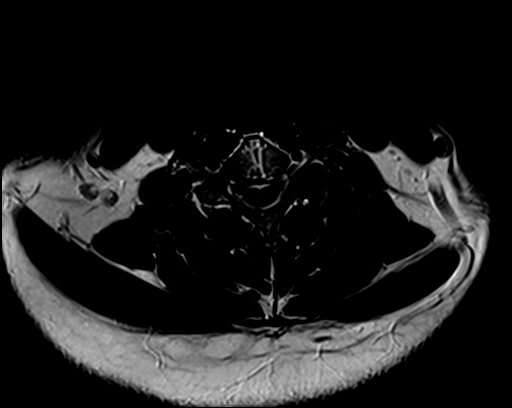
[im 25/30]
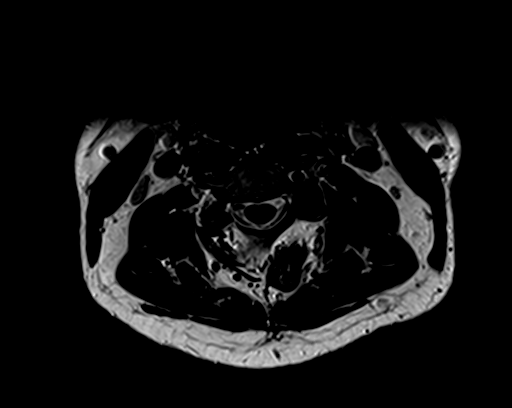

[21 of 48 positions shown; findings below may reference images not displayed]

FINDINGS: Alignment: 1.5 mm degenerative retrolisthesis at C3-4 and 2 mm
degenerative retrolisthesis at C4-5.

Vertebrae: No significant vertebral marrow edema is identified. Disc
desiccation throughout cervical spine with mild loss of disc height
at C4-5.

Cord: No significant abnormal spinal cord signal is observed.

Posterior Fossa, vertebral arteries, paraspinal tissues:
Unremarkable

Disc levels:

C2-3:  Unremarkable.

C3-4: Moderate left foraminal stenosis and mild central narrowing of
the thecal sac due to disc bulge, left paracentral disc protrusion,
uncinate spurring, and facet arthropathy.

C4-5: Moderate to prominent left and moderate right foraminal
stenosis and mild central narrowing of the thecal sac due to disc
bulge, central disc protrusion, uncinate spurring, and facet
arthropathy.

C5-6: Mild bilateral foraminal stenosis and mild central narrowing
of the thecal sac due to disc bulge and uncinate spurring.

C6-7: Borderline central narrowing of the thecal sac due to disc
bulge with central annular tear.

C7-T1:  Unremarkable.
IMPRESSION: 1. Cervical spondylosis and degenerative disc disease, causing
moderate to prominent impingement at C4-5 ; moderate impingement at
C3-4 ; and mild impingement at C5-6, as detailed above.

## 2018-07-23 DIAGNOSIS — Z23 Encounter for immunization: Secondary | ICD-10-CM | POA: Diagnosis not present

## 2020-12-27 ENCOUNTER — Encounter (HOSPITAL_COMMUNITY): Payer: Self-pay | Admitting: Neurosurgery

## 2020-12-27 ENCOUNTER — Other Ambulatory Visit: Payer: Self-pay | Admitting: Neurosurgery

## 2020-12-27 ENCOUNTER — Other Ambulatory Visit: Payer: Self-pay

## 2020-12-27 NOTE — Progress Notes (Signed)
I spoke to  Eduardo Kelly, patient's mother.  Eduardo Kelly was not at home, Eduardo Kelly states that she talks to medical office about Eduardo Kelly.  Eduardo Kelly was not at home, I informed Eduardo Kelly that I need patient to give me permission to speak with her. Eduardo Kelly called Eduardo Kelly and patient told me to speak with his mother. Eduardo Kelly, reports that Eduardo Kelly  has not had chest pain or shortness of breath. Eduardo Kelly Patient denies having any s/s of Covid in her household. No none known exposure to Covid.  Family  including, Eduardo Kelly  has had vaccines and booster. Eduardo Kelly lives with his parents.  Eduardo Kelly has Bipolar II, patient does not medications.

## 2020-12-28 ENCOUNTER — Ambulatory Visit (HOSPITAL_COMMUNITY): Payer: Medicare Other | Admitting: Certified Registered"

## 2020-12-28 ENCOUNTER — Ambulatory Visit (HOSPITAL_COMMUNITY)
Admission: RE | Admit: 2020-12-28 | Discharge: 2020-12-28 | Disposition: A | Discharge status: Home / Self Care | Payer: Medicare Other | Attending: Neurosurgery | Admitting: Neurosurgery

## 2020-12-28 ENCOUNTER — Ambulatory Visit (HOSPITAL_COMMUNITY): Payer: Medicare Other

## 2020-12-28 ENCOUNTER — Encounter (HOSPITAL_COMMUNITY)
Admission: RE | Disposition: A | Discharge status: Home / Self Care | Payer: Self-pay | Source: Home / Self Care | Attending: Neurosurgery

## 2020-12-28 ENCOUNTER — Other Ambulatory Visit: Payer: Self-pay

## 2020-12-28 ENCOUNTER — Encounter (HOSPITAL_COMMUNITY): Payer: Self-pay | Admitting: Neurosurgery

## 2020-12-28 DIAGNOSIS — Z20822 Contact with and (suspected) exposure to covid-19: Secondary | ICD-10-CM | POA: Diagnosis not present

## 2020-12-28 DIAGNOSIS — Z87891 Personal history of nicotine dependence: Secondary | ICD-10-CM | POA: Insufficient documentation

## 2020-12-28 DIAGNOSIS — M5126 Other intervertebral disc displacement, lumbar region: Secondary | ICD-10-CM | POA: Diagnosis present

## 2020-12-28 DIAGNOSIS — Z419 Encounter for procedure for purposes other than remedying health state, unspecified: Secondary | ICD-10-CM

## 2020-12-28 HISTORY — DX: Bipolar disorder, unspecified: F31.9

## 2020-12-28 HISTORY — PX: LUMBAR LAMINECTOMY/DECOMPRESSION MICRODISCECTOMY: SHX5026

## 2020-12-28 LAB — CBC
HCT: 42.5 % (ref 39.0–52.0)
Hemoglobin: 14.6 g/dL (ref 13.0–17.0)
MCH: 30 pg (ref 26.0–34.0)
MCHC: 34.4 g/dL (ref 30.0–36.0)
MCV: 87.3 fL (ref 80.0–100.0)
Platelets: 218 10*3/uL (ref 150–400)
RBC: 4.87 MIL/uL (ref 4.22–5.81)
RDW: 11.9 % (ref 11.5–15.5)
WBC: 6.6 10*3/uL (ref 4.0–10.5)
nRBC: 0 % (ref 0.0–0.2)

## 2020-12-28 LAB — SARS CORONAVIRUS 2 BY RT PCR (HOSPITAL ORDER, PERFORMED IN ~~LOC~~ HOSPITAL LAB): SARS Coronavirus 2: NEGATIVE

## 2020-12-28 SURGERY — LUMBAR LAMINECTOMY/DECOMPRESSION MICRODISCECTOMY 1 LEVEL
Anesthesia: General | Site: Spine Lumbar | Laterality: Right

## 2020-12-28 MED ORDER — ORAL CARE MOUTH RINSE: 15.0000 mL | Freq: Once | OROMUCOSAL | Status: AC

## 2020-12-28 MED ORDER — 0.9 % SODIUM CHLORIDE (POUR BTL) OPTIME
TOPICAL | Status: DC | PRN
  Administered 2020-12-28: 1000 mL

## 2020-12-28 MED ORDER — MIDAZOLAM HCL 2 MG/2ML IJ SOLN
INTRAMUSCULAR | Status: DC | PRN
  Administered 2020-12-28: 2 mg via INTRAVENOUS

## 2020-12-28 MED ORDER — FENTANYL CITRATE (PF) 100 MCG/2ML IJ SOLN
25.0000 ug | INTRAMUSCULAR | Status: DC | PRN
  Administered 2020-12-28 (×2): 50 ug via INTRAVENOUS

## 2020-12-28 MED ORDER — PHENYLEPHRINE 40 MCG/ML (10ML) SYRINGE FOR IV PUSH (FOR BLOOD PRESSURE SUPPORT)
PREFILLED_SYRINGE | INTRAVENOUS | Status: DC | PRN
  Administered 2020-12-28: 40 ug via INTRAVENOUS
  Administered 2020-12-28: 80 ug via INTRAVENOUS

## 2020-12-28 MED ORDER — AMISULPRIDE (ANTIEMETIC) 5 MG/2ML IV SOLN
5.0000 mg | Freq: Once | INTRAVENOUS | Status: AC
  Administered 2020-12-28: 5 mg via INTRAVENOUS

## 2020-12-28 MED ORDER — MIDAZOLAM HCL 2 MG/2ML IJ SOLN
INTRAMUSCULAR | Status: AC
  Filled 2020-12-28: qty 2

## 2020-12-28 MED ORDER — CHLORHEXIDINE GLUCONATE CLOTH 2 % EX PADS: 6.0000 | MEDICATED_PAD | Freq: Once | CUTANEOUS | Status: DC

## 2020-12-28 MED ORDER — PROPOFOL 10 MG/ML IV BOLUS
INTRAVENOUS | Status: DC | PRN
  Administered 2020-12-28: 200 mg via INTRAVENOUS

## 2020-12-28 MED ORDER — ROCURONIUM BROMIDE 10 MG/ML (PF) SYRINGE
PREFILLED_SYRINGE | INTRAVENOUS | Status: AC
  Filled 2020-12-28: qty 10

## 2020-12-28 MED ORDER — PROPOFOL 10 MG/ML IV BOLUS
INTRAVENOUS | Status: AC
  Filled 2020-12-28: qty 20

## 2020-12-28 MED ORDER — OXYCODONE HCL 5 MG PO TABS
ORAL_TABLET | ORAL | Status: AC
  Filled 2020-12-28: qty 2

## 2020-12-28 MED ORDER — LIDOCAINE 2% (20 MG/ML) 5 ML SYRINGE
INTRAMUSCULAR | Status: AC
  Filled 2020-12-28: qty 5

## 2020-12-28 MED ORDER — ACETAMINOPHEN 500 MG PO TABS: 1000.0000 mg | ORAL_TABLET | Freq: Once | ORAL | Status: DC | PRN

## 2020-12-28 MED ORDER — ONDANSETRON HCL 4 MG/2ML IJ SOLN
INTRAMUSCULAR | Status: DC | PRN
  Administered 2020-12-28: 4 mg via INTRAVENOUS

## 2020-12-28 MED ORDER — ACETAMINOPHEN 10 MG/ML IV SOLN
INTRAVENOUS | Status: DC | PRN
  Administered 2020-12-28: 1000 mg via INTRAVENOUS

## 2020-12-28 MED ORDER — OXYCODONE HCL 5 MG/5ML PO SOLN: 5.0000 mg | Freq: Once | ORAL | Status: DC | PRN

## 2020-12-28 MED ORDER — AMISULPRIDE (ANTIEMETIC) 5 MG/2ML IV SOLN
INTRAVENOUS | Status: AC
  Filled 2020-12-28: qty 2

## 2020-12-28 MED ORDER — OXYCODONE HCL 5 MG PO TABS: 5.0000 mg | ORAL_TABLET | Freq: Once | ORAL | Status: DC | PRN

## 2020-12-28 MED ORDER — ROCURONIUM 10MG/ML (10ML) SYRINGE FOR MEDFUSION PUMP - OPTIME
INTRAVENOUS | Status: DC | PRN
  Administered 2020-12-28: 70 mg via INTRAVENOUS

## 2020-12-28 MED ORDER — LIDOCAINE-EPINEPHRINE 0.5 %-1:200000 IJ SOLN
INTRAMUSCULAR | Status: AC
  Filled 2020-12-28: qty 1

## 2020-12-28 MED ORDER — ONDANSETRON HCL 4 MG/2ML IJ SOLN
INTRAMUSCULAR | Status: AC
  Filled 2020-12-28: qty 2

## 2020-12-28 MED ORDER — CEFAZOLIN SODIUM-DEXTROSE 2-4 GM/100ML-% IV SOLN
2.0000 g | INTRAVENOUS | Status: AC
  Administered 2020-12-28: 2 g via INTRAVENOUS
  Filled 2020-12-28: qty 100

## 2020-12-28 MED ORDER — THROMBIN 5000 UNITS EX SOLR
CUTANEOUS | Status: AC
  Filled 2020-12-28: qty 10000

## 2020-12-28 MED ORDER — FENTANYL CITRATE (PF) 250 MCG/5ML IJ SOLN
INTRAMUSCULAR | Status: DC | PRN
  Administered 2020-12-28: 150 ug via INTRAVENOUS
  Administered 2020-12-28 (×2): 50 ug via INTRAVENOUS

## 2020-12-28 MED ORDER — DEXAMETHASONE SODIUM PHOSPHATE 10 MG/ML IJ SOLN
INTRAMUSCULAR | Status: AC
  Filled 2020-12-28: qty 1

## 2020-12-28 MED ORDER — HYDROMORPHONE HCL 1 MG/ML IJ SOLN
INTRAMUSCULAR | Status: AC
  Filled 2020-12-28: qty 1

## 2020-12-28 MED ORDER — HYDROMORPHONE HCL 1 MG/ML IJ SOLN
0.2500 mg | INTRAMUSCULAR | Status: DC | PRN
  Administered 2020-12-28 (×2): 0.5 mg via INTRAVENOUS

## 2020-12-28 MED ORDER — KETAMINE HCL-SODIUM CHLORIDE 100-0.9 MG/10ML-% IV SOSY
PREFILLED_SYRINGE | INTRAVENOUS | Status: DC | PRN
  Administered 2020-12-28 (×2): 10 mg via INTRAVENOUS
  Administered 2020-12-28: 30 mg via INTRAVENOUS

## 2020-12-28 MED ORDER — ACETAMINOPHEN 10 MG/ML IV SOLN: 1000.0000 mg | Freq: Once | INTRAVENOUS | Status: DC | PRN

## 2020-12-28 MED ORDER — LACTATED RINGERS IV SOLN: INTRAVENOUS | Status: DC

## 2020-12-28 MED ORDER — LIDOCAINE-EPINEPHRINE 0.5 %-1:200000 IJ SOLN
INTRAMUSCULAR | Status: DC | PRN
  Administered 2020-12-28: 10 mL

## 2020-12-28 MED ORDER — LACTATED RINGERS IV SOLN: INTRAVENOUS | Status: DC | PRN

## 2020-12-28 MED ORDER — THROMBIN 5000 UNITS EX SOLR
CUTANEOUS | Status: DC | PRN
  Administered 2020-12-28 (×2): 5000 [IU] via TOPICAL

## 2020-12-28 MED ORDER — KETOROLAC TROMETHAMINE 30 MG/ML IJ SOLN
INTRAMUSCULAR | Status: AC
  Filled 2020-12-28: qty 1

## 2020-12-28 MED ORDER — CHLORHEXIDINE GLUCONATE 0.12 % MT SOLN: 15.0000 mL | Freq: Once | OROMUCOSAL | Status: AC

## 2020-12-28 MED ORDER — DEXAMETHASONE SODIUM PHOSPHATE 10 MG/ML IJ SOLN
INTRAMUSCULAR | Status: DC | PRN
  Administered 2020-12-28: 10 mg via INTRAVENOUS

## 2020-12-28 MED ORDER — HEMOSTATIC AGENTS (NO CHARGE) OPTIME
TOPICAL | Status: DC | PRN
  Administered 2020-12-28: 1 via TOPICAL

## 2020-12-28 MED ORDER — CHLORHEXIDINE GLUCONATE 0.12 % MT SOLN
OROMUCOSAL | Status: AC
  Administered 2020-12-28: 15 mL via OROMUCOSAL
  Filled 2020-12-28: qty 15

## 2020-12-28 MED ORDER — SUGAMMADEX SODIUM 200 MG/2ML IV SOLN
INTRAVENOUS | Status: DC | PRN
  Administered 2020-12-28: 200 mg via INTRAVENOUS

## 2020-12-28 MED ORDER — FENTANYL CITRATE (PF) 250 MCG/5ML IJ SOLN
INTRAMUSCULAR | Status: AC
  Filled 2020-12-28: qty 5

## 2020-12-28 MED ORDER — ACETAMINOPHEN 160 MG/5ML PO SOLN: 1000.0000 mg | Freq: Once | ORAL | Status: DC | PRN

## 2020-12-28 MED ORDER — LIDOCAINE HCL (CARDIAC) PF 100 MG/5ML IV SOSY
PREFILLED_SYRINGE | INTRAVENOUS | Status: DC | PRN
  Administered 2020-12-28: 80 mg via INTRATRACHEAL

## 2020-12-28 MED ORDER — FENTANYL CITRATE (PF) 100 MCG/2ML IJ SOLN
INTRAMUSCULAR | Status: AC
  Filled 2020-12-28: qty 2

## 2020-12-28 MED ORDER — OXYCODONE HCL 5 MG PO TABS
5.0000 mg | ORAL_TABLET | Freq: Once | ORAL | Status: AC | PRN
  Administered 2020-12-28: 10 mg via ORAL

## 2020-12-28 MED ORDER — KETOROLAC TROMETHAMINE 30 MG/ML IJ SOLN
INTRAMUSCULAR | Status: DC | PRN
  Administered 2020-12-28: 30 mg via INTRAVENOUS

## 2020-12-28 SURGICAL SUPPLY — 51 items
ADH SKN CLS APL DERMABOND .7 (GAUZE/BANDAGES/DRESSINGS) ×1
APL SKNCLS STERI-STRIP NONHPOA (GAUZE/BANDAGES/DRESSINGS)
BAG COUNTER SPONGE SURGICOUNT (BAG) ×2 IMPLANT
BAG SPNG CNTER NS LX DISP (BAG) ×1
BAND INSRT 18 STRL LF DISP RB (MISCELLANEOUS) ×2
BAND RUBBER #18 3X1/16 STRL (MISCELLANEOUS) ×4 IMPLANT
BENZOIN TINCTURE PRP APPL 2/3 (GAUZE/BANDAGES/DRESSINGS) IMPLANT
BLADE CLIPPER SURG (BLADE) IMPLANT
BUR MATCHSTICK NEURO 3.0 LAGG (BURR) ×2 IMPLANT
BUR PRECISION FLUTE 5.0 (BURR) IMPLANT
CANISTER SUCT 3000ML PPV (MISCELLANEOUS) ×2 IMPLANT
CARTRIDGE OIL MAESTRO DRILL (MISCELLANEOUS) ×1 IMPLANT
DECANTER SPIKE VIAL GLASS SM (MISCELLANEOUS) ×2 IMPLANT
DERMABOND ADVANCED (GAUZE/BANDAGES/DRESSINGS) ×1
DERMABOND ADVANCED .7 DNX12 (GAUZE/BANDAGES/DRESSINGS) ×1 IMPLANT
DIFFUSER DRILL AIR PNEUMATIC (MISCELLANEOUS) ×2 IMPLANT
DRAPE LAPAROTOMY 100X72X124 (DRAPES) ×2 IMPLANT
DRAPE MICROSCOPE LEICA (MISCELLANEOUS) ×2 IMPLANT
DRAPE SURG 17X23 STRL (DRAPES) ×2 IMPLANT
DURAPREP 26ML APPLICATOR (WOUND CARE) ×2 IMPLANT
ELECT REM PT RETURN 9FT ADLT (ELECTROSURGICAL) ×2
ELECTRODE REM PT RTRN 9FT ADLT (ELECTROSURGICAL) ×1 IMPLANT
GAUZE 4X4 16PLY ~~LOC~~+RFID DBL (SPONGE) IMPLANT
GAUZE SPONGE 4X4 12PLY STRL (GAUZE/BANDAGES/DRESSINGS) IMPLANT
GLOVE EXAM NITRILE XL STR (GLOVE) IMPLANT
GLOVE SURG LTX SZ6.5 (GLOVE) ×2 IMPLANT
GOWN STRL REUS W/ TWL LRG LVL3 (GOWN DISPOSABLE) ×2 IMPLANT
GOWN STRL REUS W/ TWL XL LVL3 (GOWN DISPOSABLE) IMPLANT
GOWN STRL REUS W/TWL 2XL LVL3 (GOWN DISPOSABLE) IMPLANT
GOWN STRL REUS W/TWL LRG LVL3 (GOWN DISPOSABLE) ×4
GOWN STRL REUS W/TWL XL LVL3 (GOWN DISPOSABLE)
KIT BASIN OR (CUSTOM PROCEDURE TRAY) ×2 IMPLANT
KIT TURNOVER KIT B (KITS) ×2 IMPLANT
NDL HYPO 25X1 1.5 SAFETY (NEEDLE) ×1 IMPLANT
NDL SPNL 18GX3.5 QUINCKE PK (NEEDLE) IMPLANT
NEEDLE HYPO 25X1 1.5 SAFETY (NEEDLE) ×2 IMPLANT
NEEDLE SPNL 18GX3.5 QUINCKE PK (NEEDLE) IMPLANT
NS IRRIG 1000ML POUR BTL (IV SOLUTION) ×2 IMPLANT
OIL CARTRIDGE MAESTRO DRILL (MISCELLANEOUS) ×2
PACK LAMINECTOMY NEURO (CUSTOM PROCEDURE TRAY) ×2 IMPLANT
PAD ARMBOARD 7.5X6 YLW CONV (MISCELLANEOUS) ×6 IMPLANT
SPONGE SURGIFOAM ABS GEL SZ50 (HEMOSTASIS) ×2 IMPLANT
SPONGE T-LAP 4X18 ~~LOC~~+RFID (SPONGE) IMPLANT
STRIP CLOSURE SKIN 1/2X4 (GAUZE/BANDAGES/DRESSINGS) IMPLANT
SUT VIC AB 0 CT1 18XCR BRD8 (SUTURE) ×1 IMPLANT
SUT VIC AB 0 CT1 8-18 (SUTURE) ×2
SUT VIC AB 2-0 CT1 18 (SUTURE) ×2 IMPLANT
SUT VIC AB 3-0 SH 8-18 (SUTURE) ×2 IMPLANT
TOWEL GREEN STERILE (TOWEL DISPOSABLE) ×2 IMPLANT
TOWEL GREEN STERILE FF (TOWEL DISPOSABLE) ×2 IMPLANT
WATER STERILE IRR 1000ML POUR (IV SOLUTION) ×2 IMPLANT

## 2020-12-28 NOTE — Transfer of Care (Signed)
Immediate Anesthesia Transfer of Care Note  Patient: Eduardo Kelly  Procedure(s) Performed: Right Lumbar Four-Five Microdiscectomy (Right: Spine Lumbar)  Patient Location: PACU  Anesthesia Type:General  Level of Consciousness: awake, patient cooperative and responds to stimulation  Airway & Oxygen Therapy: Patient Spontanous Breathing and Patient connected to nasal cannula oxygen  Post-op Assessment: Report given to RN and Post -op Vital signs reviewed and stable  Post vital signs: Reviewed and stable  Last Vitals:  Vitals Value Taken Time  BP 127/79 12/28/20 1710  Temp    Pulse 98 12/28/20 1711  Resp 27 12/28/20 1711  SpO2 99 % 12/28/20 1711  Vitals shown include unvalidated device data.  Last Pain:  Vitals:   12/28/20 1144  TempSrc:   PainSc: 0-No pain         Complications: No notable events documented.

## 2020-12-28 NOTE — Op Note (Signed)
12/28/2020  5:30 PM  PATIENT:  Eduardo Kelly  50 y.o. 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 Weimer was taken to the operating room, intubated and placed under a general anesthetic without difficulty. He was positioned prone on a Wilson frame with all pressure points padded. His back was prepped and draped in a sterile manner. I opened the skin with a 10 blade and carried the dissection down to the thoracolumbar fascia. I used both sharp dissection and the monopolar cautery to expose the lamina of L4, and 5. I confirmed my location with an intraoperative xray.  I used the drill, Kerrison punches, and curettes to perform a semihemilaminectomy of L4. I used the punches to remove the ligamentum flavum to expose the thecal sac. I brought the microscope into the operative field and started the decompression of the spinal canal, thecal sac and L4 and 5 root(s). I cauterized epidural veins overlying the disc space then divided them sharply. I opened the disc space with a 15 blade and proceeded with the discectomy. I used pituitary rongeurs, curettes, and other instruments to remove disc material. After the discectomy was completed I inspected the L4,5 nerve roots and felt it was well decompressed. I explored rostrally, laterally, medially, and caudally and was satisfied with the decompression. I irrigated the wound, then closed in layers. I approximated the thoracolumbar fascia, subcutaneous, and  subcuticular planes with vicryl sutures. I used dermabond for a sterile dressing.   PLAN OF CARE: Discharge to home after PACU  PATIENT DISPOSITION:  PACU - hemodynamically stable.   Delay start of Pharmacological VTE agent (>24hrs) due to surgical blood loss or risk of bleeding:  no

## 2020-12-28 NOTE — H&P (Signed)
;  BP (!) 139/95   Pulse 99   Temp (!) 97.3 F (36.3 C) (Oral)   Resp 18   Ht 5\' 9"  (1.753 m)   Wt 95.3 kg   SpO2 98%   BMI 31.01 kg/m     Mr. Sylla comes in today for evaluation of pain that he has in his back and right lower extremity.  Mr. Fesperman gave a very long and somewhat complex history.  He says approximately 2 weeks ago, he just came down with acute pain in his lower back.  As a result of this, he thought that possibly cutting the grass might be a helpful thing, and so he did, but he found out that it caused burning, searing pain soon after he touched the lawnmower, but decided that he was going to push through it.  However, he could not fight through it, given his words, because he was in horrible pain after approximately 20 minutes.  Mr. Sedita has not had urinary problems.  He says he has had diarrhea, which may be due to the Motrin he is taking.  He is taking 900 a day.  He does not have any significant history with regard to the pain.  There is no antecedent trauma.  He is just not sure why it happened.  I had seen Mr. Lagrand last in 2017 and at that time it was for his cervical spine.  I had seen an MRI from 2008 of his lumbar spine and that did not show anything too significant.     REVIEW OF SYSTEMS :  He reports back pain and leg pain, tightness and burning sensation in his legs and thigh.  He has weakness in his leg and foot on the right, numbness and tingling on the feet and legs.  He says his pain is a 6/10, whereas typically it had not been that severe.     He has a history of substance abuse.  He did relate during these past 14 days that one day he woke up, he could barely walk, he was going to try to go to the ED; he did not, but he did down a bottle of vodka to try to deal with his habit.  He has abused narcotics in the past along with alcohol and I do believe other pills.  He is on disability and does carry a psychiatric diagnosis of depression and on the other side he was  diagnosed as bipolar in 2001.  There is a history of bipolar disorder in his family on both sides.     PHYSICAL EXAMINATION :  Mr. Borjon on exam is alert and oriented x4.  Speech is clear, I would almost say slightly pressured, fluent.  He is cooperative and answers all questions.  5/5 strength in the upper and lower extremities, 2+ reflexes at the biceps, triceps, brachioradialis, knees, and ankles.  Proprioception is intact.  Romberg is negative.  Gait is otherwise normal.  Normal muscle tone, bulk, and coordination.

## 2020-12-28 NOTE — Anesthesia Procedure Notes (Signed)
Procedure Name: Intubation Date/Time: 12/28/2020 3:33 PM Performed by: Betha Loa, CRNA Pre-anesthesia Checklist: Patient identified, Emergency Drugs available, Suction available and Patient being monitored Patient Re-evaluated:Patient Re-evaluated prior to induction Oxygen Delivery Method: Circle System Utilized Preoxygenation: Pre-oxygenation with 100% oxygen Induction Type: IV induction Ventilation: Mask ventilation without difficulty and Oral airway inserted - appropriate to patient size Laryngoscope Size: Mac and 3 Grade View: Grade II Tube type: Oral Number of attempts: 1 Airway Equipment and Method: Stylet and Oral airway Placement Confirmation: ETT inserted through vocal cords under direct vision, positive ETCO2 and breath sounds checked- equal and bilateral Secured at: 21 cm Tube secured with: Tape Dental Injury: Teeth and Oropharynx as per pre-operative assessment

## 2020-12-28 NOTE — Anesthesia Preprocedure Evaluation (Signed)
Anesthesia Evaluation  Patient identified by MRN, date of birth, ID band Patient awake    Reviewed: Allergy & Precautions, NPO status , Patient's Chart, lab work & pertinent test results  History of Anesthesia Complications Negative for: history of anesthetic complications  Airway Mallampati: III   Neck ROM: Full  Mouth opening: Limited Mouth Opening  Dental  (+) Dental Advisory Given, Teeth Intact   Pulmonary former smoker,  Covid-19 Nucleic Acid Test Results Lab Results      Component                Value               Date                      SARSCOV2NAA              NEGATIVE            12/28/2020              breath sounds clear to auscultation       Cardiovascular negative cardio ROS   Rhythm:Regular     Neuro/Psych PSYCHIATRIC DISORDERS Bipolar Disorder Disc displacement, Lumbar  Neuromuscular disease    GI/Hepatic Neg liver ROS, GERD  Controlled,  Endo/Other  negative endocrine ROS  Renal/GU negative Renal ROS     Musculoskeletal  (+) Arthritis ,   Abdominal   Peds  Hematology negative hematology ROS (+)   Anesthesia Other Findings   Reproductive/Obstetrics                             Anesthesia Physical Anesthesia Plan  ASA: 2  Anesthesia Plan: General   Post-op Pain Management:    Induction: Intravenous  PONV Risk Score and Plan: 2 and Ondansetron and Dexamethasone  Airway Management Planned: Oral ETT  Additional Equipment: None  Intra-op Plan:   Post-operative Plan: Extubation in OR  Informed Consent: I have reviewed the patients History and Physical, chart, labs and discussed the procedure including the risks, benefits and alternatives for the proposed anesthesia with the patient or authorized representative who has indicated his/her understanding and acceptance.     Dental advisory given  Plan Discussed with: CRNA and Anesthesiologist  Anesthesia Plan  Comments:         Anesthesia Quick Evaluation

## 2020-12-28 NOTE — Progress Notes (Signed)
Pacu Discharge Note  Patient instuctions were given to family. Wound care, diet, pain, follow up care and how and whom to contact with concerns were discussed. Family aware someone needs to remain with patient overnight and concerns after receiving anesthesia and what to avoid and safety. Answered all questions and concerns.   No Tens Units until you see Dr  Alphonzo Lemmings at you follow up appointment in 2 weeks.   Discharge paperwork has clear contact informations for surgeon and 24 hour RN line for concerns.   Discussed what concerns to look for including infection and signs/symptoms to look for.   IV was removed prior to discharge. Patient was brought to car with belongings.   Pt exits my care.

## 2020-12-28 NOTE — Discharge Instructions (Signed)
Lumbar Discectomy °Care After °A discectomy involves removal of discmaterial (the cartilage-like structures located between the bones of the back). It is done to relieve pressure on nerve roots. It can be used as a treatment for a back problem. The time in surgery depends on the findings in surgery and what is necessary to correct the problems. °HOME CARE INSTRUCTIONS  °· Check the cut (incision) made by the surgeon twice a day for signs of infection. Some signs of infection may include:  °· A foul smelling, greenish or yellowish discharge from the wound.  °· Increased pain.  °· Increased redness over the incision (operative) site.  °· The skin edges may separate.  °· Flu-like symptoms (problems).  °· A temperature above 101.5° F (38.6° C).  °· Change your bandages in about 24 to 36 hours following surgery or as directed.  °· You may shower tomrrow.  Avoid bathtubs, swimming pools and hot tubs for three weeks or until your incision has healed completely. °· Follow your doctor's instructions as to safe activities, exercises, and physical therapy.  °· Weight reduction may be beneficial if you are overweight.  °· Daily exercise is helpful to prevent the return of problems. Walking is permitted. You may use a treadmill without an incline. Cut down on activities and exercise if you have discomfort. You may also go up and down stairs as much as you can tolerate.  °· DO NOT lift anything heavier than 10 to 15 lbs. Avoid bending or twisting at the waist. Always bend your knees when lifting.  °· Maintain strength and range of motion as instructed.  °· Do not drive for 10 days, or as directed by your doctors. You may be a passenger . Lying back in the passenger seat may be more comfortable for you. Always wear a seatbelt.  °· Limit your sitting in a regular chair to 20 to 30 minutes at a time. There are no limitations for sitting in a recliner. You should lie down or walk in between sitting periods.  °· Only take  over-the-counter or prescription medicines for pain, discomfort, or fever as directed by your caregiver.  °SEEK MEDICAL CARE IF:  °· There is increased bleeding (more than a small spot) from the wound.  °· You notice redness, swelling, or increasing pain in the wound.  °· Pus is coming from wound.  °· You develop an unexplained oral temperature above 102° F (38.9° C) develops.  °· You notice a foul smell coming from the wound or dressing.  °· You have increasing pain in your wound.  °SEEK IMMEDIATE MEDICAL CARE IF:  °· You develop a rash.  °· You have difficulty breathing.  °· You develop any allergic problems to medicines given.  °Document Released: 03/28/2004 Document Revised: 04/12/2011 Document Reviewed: 07/17/2007 °ExitCare® Patient Information °

## 2020-12-29 ENCOUNTER — Encounter (HOSPITAL_COMMUNITY): Payer: Self-pay | Admitting: Neurosurgery

## 2021-01-03 NOTE — Anesthesia Postprocedure Evaluation (Signed)
Anesthesia Post Note  Patient: Eduardo Kelly  Procedure(s) Performed: Right Lumbar Four-Five Microdiscectomy (Right: Spine Lumbar)     Patient location during evaluation: PACU Anesthesia Type: General Level of consciousness: awake and alert Pain management: pain level controlled Vital Signs Assessment: post-procedure vital signs reviewed and stable Respiratory status: spontaneous breathing, nonlabored ventilation, respiratory function stable and patient connected to nasal cannula oxygen Cardiovascular status: blood pressure returned to baseline and stable Postop Assessment: no apparent nausea or vomiting Anesthetic complications: no   No notable events documented.  Last Vitals:  Vitals:   12/28/20 1835 12/28/20 1840  BP:  135/89  Pulse: 89 91  Resp: 13 18  Temp:    SpO2: 96% 100%    Last Pain:  Vitals:   12/28/20 1840  TempSrc:   PainSc: 3                  Osbaldo Mark
